# Patient Record
Sex: Male | Born: 1970 | Hispanic: No | Smoking: Current some day smoker
Health system: Southern US, Community
[De-identification: ages and names within clinical notes are randomized; demographics above are authoritative.]

## PROBLEM LIST (undated history)

## (undated) DIAGNOSIS — J329 Chronic sinusitis, unspecified: Secondary | ICD-10-CM

## (undated) DIAGNOSIS — J45909 Unspecified asthma, uncomplicated: Secondary | ICD-10-CM

## (undated) DIAGNOSIS — J80 Acute respiratory distress syndrome: Secondary | ICD-10-CM

## (undated) DIAGNOSIS — F419 Anxiety disorder, unspecified: Secondary | ICD-10-CM

---

## 2007-04-16 ENCOUNTER — Emergency Department (HOSPITAL_COMMUNITY): Admission: EM | Admit: 2007-04-16 | Discharge: 2007-04-16 | Payer: Self-pay | Admitting: *Deleted

## 2008-03-28 LAB — PULMONARY FUNCTION TEST

## 2008-04-04 HISTORY — PX: BUNIONECTOMY: SHX129

## 2010-12-09 LAB — POCT INFLUENZA A/B: Influenza B, POC: NEGATIVE
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
## 2011-11-09 ENCOUNTER — Emergency Department
Admission: EM | Admit: 2011-11-09 | Discharge: 2011-11-09 | Disposition: A | Discharge status: Home / Self Care | Payer: Managed Care, Other (non HMO) | Source: Home / Self Care | Attending: Emergency Medicine | Admitting: Emergency Medicine

## 2011-11-09 ENCOUNTER — Encounter: Payer: Self-pay | Admitting: Emergency Medicine

## 2011-11-09 DIAGNOSIS — J069 Acute upper respiratory infection, unspecified: Secondary | ICD-10-CM

## 2011-11-09 MED ORDER — AMOXICILLIN-POT CLAVULANATE 875-125 MG PO TABS: 1.0000 | ORAL_TABLET | Freq: Two times a day (BID) | ORAL | Status: AC

## 2011-11-09 NOTE — ED Provider Notes (Signed)
History     CSN: [id number] Arrival date & time: 11/09/2011  1:59 PM   First MD Initiated Contact with Patient 11/09/11 1422      Chief Complaint  Patient presents with  . URI    (Consider location/radiation/quality/duration/timing/severity/associated sxs/prior treatment) HPI Grant Peterson is a 40 y.o. male who complains of onset of cold symptoms for 4 days.  The symptoms are listed below. Also over the last one to 2 months he has been having some lingering postnasal drip cough and congestion. Since yesterday he is feeling a lot better but decided to come to have it checked out today. No sore throat + cough No pleuritic pain No wheezing + nasal congestion + post-nasal drainage + sinus pain/pressure No chest congestion No itchy/red eyes No earache No hemoptysis No SOB + chills/sweats + fever + nausea No vomiting No abdominal pain No diarrhea No skin rashes + fatigue + myalgias + headache     History reviewed. No pertinent past medical history.  No past surgical history on file.  No family history on file.  History  Substance Use Topics  . Smoking status: Not on file  . Smokeless tobacco: Not on file  . Alcohol Use: Not on file      Review of Systems  Allergies  Review of patient's allergies indicates no known allergies.  Home Medications   Current Outpatient Rx  Name Route Sig Dispense Refill  . GUAIFENESIN ER 600 MG PO TB12 Oral Take 1,200 mg by mouth 2 (two) times daily.      Marland Kitchen PHENYLEPHRINE HCL 10 MG PO TABS Oral Take 10 mg by mouth every 4 (four) hours as needed.        BP 139/67  Pulse 77  Temp(Src) 99.2 F (37.3 C) (Oral)  Resp 16  Ht 5\' 7"  (1.702 m)  Wt 175 lb (79.379 kg)  BMI 27.41 kg/m2  SpO2 99%  Physical Exam  Nursing note and vitals reviewed. Constitutional: He is oriented to person, place, and time. He appears well-developed and well-nourished.  HENT:  Head: Normocephalic and atraumatic.  Right Ear: Tympanic membrane, external  ear and ear canal normal.  Left Ear: Tympanic membrane, external ear and ear canal normal.  Nose: Mucosal edema and rhinorrhea present.  Mouth/Throat: Posterior oropharyngeal erythema present. No oropharyngeal exudate or posterior oropharyngeal edema.  Neck: Neck supple.  Cardiovascular: Regular rhythm and normal heart sounds.   Pulmonary/Chest: Effort normal and breath sounds normal. No respiratory distress.  Neurological: He is alert and oriented to person, place, and time.  Skin: Skin is warm and dry.  Psychiatric: He has a normal mood and affect. His speech is normal.    ED Course  Procedures (including critical care time)  Labs Reviewed - No data to display No results found.   No diagnosis found.    MDM  1)  Take the prescribed antibiotic as instructed. The rapid flu test is negative. Since he has had lingering symptoms off and on I would like treatment for chronic sinusitis with 10 days of Augmentin. This would also cover some bronchitis since I did hear some wheezing on his physical examination. 2)  Use nasal saline solution (over the counter) at least 3 times a day. 3)  Use over the counter decongestants like Zyrtec-D every 12 hours as needed to help with congestion.  If you have hypertension, do not take medicines with sudafed.  4)  Can take tylenol every 6 hours or motrin every 8 hours for pain or fever.  5)  Follow up with your primary doctor if no improvement in 5-7 days, sooner if increasing pain, fever, or new symptoms.     Lily Kocher, MD 11/09/11 1434

## 2011-11-09 NOTE — ED Notes (Signed)
Sinus pressure, headache, post-nasal drip x 2 months Fever, chills, nausea, chills, congestion, body aches, fatigue, productive cough (green) loose stools x 4days

## 2012-07-10 ENCOUNTER — Emergency Department
Admission: EM | Admit: 2012-07-10 | Discharge: 2012-07-10 | Disposition: A | Discharge status: Home / Self Care | Payer: Managed Care, Other (non HMO) | Source: Home / Self Care

## 2012-07-10 ENCOUNTER — Encounter: Payer: Self-pay | Admitting: *Deleted

## 2012-07-10 DIAGNOSIS — J069 Acute upper respiratory infection, unspecified: Secondary | ICD-10-CM

## 2012-07-10 HISTORY — DX: Acute respiratory distress syndrome: J80

## 2012-07-10 HISTORY — DX: Unspecified asthma, uncomplicated: J45.909

## 2012-07-10 MED ORDER — AZITHROMYCIN 250 MG PO TABS: ORAL_TABLET | ORAL | Status: AC

## 2012-07-10 MED ORDER — METHYLPREDNISOLONE ACETATE 80 MG/ML IJ SUSP
80.0000 mg | Freq: Once | INTRAMUSCULAR | Status: AC
  Administered 2012-07-10: 80 mg via INTRAMUSCULAR

## 2012-07-10 NOTE — ED Provider Notes (Signed)
Agree with care plan.  
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
## 2012-07-10 NOTE — ED Notes (Signed)
Patient c/o productive cough, congested, fatigue, and sob x 4 days. Taken vitamin c and afrin otc.

## 2012-12-20 ENCOUNTER — Emergency Department
Admission: EM | Admit: 2012-12-20 | Discharge: 2012-12-20 | Disposition: A | Discharge status: Home / Self Care | Payer: 59 | Source: Home / Self Care | Attending: Family Medicine | Admitting: Family Medicine

## 2012-12-20 ENCOUNTER — Encounter: Payer: Self-pay | Admitting: Emergency Medicine

## 2012-12-20 ENCOUNTER — Emergency Department (INDEPENDENT_AMBULATORY_CARE_PROVIDER_SITE_OTHER): Payer: 59

## 2012-12-20 DIAGNOSIS — J45901 Unspecified asthma with (acute) exacerbation: Secondary | ICD-10-CM

## 2012-12-20 DIAGNOSIS — R05 Cough: Secondary | ICD-10-CM

## 2012-12-20 DIAGNOSIS — J309 Allergic rhinitis, unspecified: Secondary | ICD-10-CM

## 2012-12-20 DIAGNOSIS — R0989 Other specified symptoms and signs involving the circulatory and respiratory systems: Secondary | ICD-10-CM

## 2012-12-20 DIAGNOSIS — Z716 Tobacco abuse counseling: Secondary | ICD-10-CM

## 2012-12-20 DIAGNOSIS — Z87891 Personal history of nicotine dependence: Secondary | ICD-10-CM

## 2012-12-20 DIAGNOSIS — J329 Chronic sinusitis, unspecified: Secondary | ICD-10-CM

## 2012-12-20 MED ORDER — FLUTICASONE-SALMETEROL 100-50 MCG/DOSE IN AEPB: 1.0000 | INHALATION_SPRAY | Freq: Two times a day (BID) | RESPIRATORY_TRACT | Status: DC

## 2012-12-20 MED ORDER — CETIRIZINE HCL 10 MG PO CAPS: 10.0000 mg | ORAL_CAPSULE | Freq: Every day | ORAL | Status: DC

## 2012-12-20 MED ORDER — PREDNISONE 50 MG PO TABS: ORAL_TABLET | ORAL | Status: DC

## 2012-12-20 MED ORDER — METHYLPREDNISOLONE SODIUM SUCC 125 MG IJ SOLR
125.0000 mg | Freq: Once | INTRAMUSCULAR | Status: AC
  Administered 2012-12-20: 125 mg via INTRAMUSCULAR

## 2012-12-20 MED ORDER — FLUTICASONE PROPIONATE 50 MCG/ACT NA SUSP: 2.0000 | Freq: Every day | NASAL | Status: DC

## 2012-12-20 MED ORDER — AZITHROMYCIN 250 MG PO TABS: ORAL_TABLET | ORAL | Status: DC

## 2012-12-20 NOTE — ED Notes (Signed)
Reports hx of nasal congestion x 3 months; has some dyspnea on exertion and occasional wheezing. No Flu vaccination this season.
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
## 2012-12-22 ENCOUNTER — Telehealth: Payer: Self-pay | Admitting: Emergency Medicine

## 2014-03-17 ENCOUNTER — Encounter (INDEPENDENT_AMBULATORY_CARE_PROVIDER_SITE_OTHER): Payer: Self-pay

## 2014-03-17 ENCOUNTER — Encounter: Payer: Self-pay | Admitting: Family Medicine

## 2014-03-17 ENCOUNTER — Ambulatory Visit (INDEPENDENT_AMBULATORY_CARE_PROVIDER_SITE_OTHER): Payer: 59 | Admitting: Family Medicine

## 2014-03-17 VITALS — BP 128/80 | HR 69 | Ht 66.0 in | Wt 177.0 lb

## 2014-03-17 DIAGNOSIS — F3289 Other specified depressive episodes: Secondary | ICD-10-CM

## 2014-03-17 DIAGNOSIS — K5900 Constipation, unspecified: Secondary | ICD-10-CM

## 2014-03-17 DIAGNOSIS — F32A Depression, unspecified: Secondary | ICD-10-CM

## 2014-03-17 DIAGNOSIS — J309 Allergic rhinitis, unspecified: Secondary | ICD-10-CM

## 2014-03-17 DIAGNOSIS — J45909 Unspecified asthma, uncomplicated: Secondary | ICD-10-CM | POA: Insufficient documentation

## 2014-03-17 DIAGNOSIS — F329 Major depressive disorder, single episode, unspecified: Secondary | ICD-10-CM

## 2014-03-17 DIAGNOSIS — T1490XA Injury, unspecified, initial encounter: Secondary | ICD-10-CM

## 2014-03-17 DIAGNOSIS — J302 Other seasonal allergic rhinitis: Secondary | ICD-10-CM

## 2014-03-17 MED ORDER — FLUTICASONE PROPIONATE 50 MCG/ACT NA SUSP: 2.0000 | Freq: Every day | NASAL | Status: DC

## 2014-03-17 MED ORDER — CITALOPRAM HYDROBROMIDE 20 MG PO TABS: 10.0000 mg | ORAL_TABLET | Freq: Every day | ORAL | Status: DC

## 2014-03-17 MED ORDER — FLUTICASONE-SALMETEROL 100-50 MCG/DOSE IN AEPB: 1.0000 | INHALATION_SPRAY | Freq: Two times a day (BID) | RESPIRATORY_TRACT | Status: DC

## 2014-03-17 NOTE — Progress Notes (Signed)
CC: Grant Peterson is a 43 y.o. 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  Denies any recent or remote exertional chest pain. He does get shortness of breath with physical activity which is described as mild and has not been getting better or worse for matter of years. It does not limit him in any exertional activity.  Reports a history of [place] which [place] [facility].  Since this he experiences wheezing and less using Advair on a daily basis. He denies any need or use of albuterol over the past year. Nothing particularly makes symptoms better or worse other than above. He denies cough, wheezing, nor chest tightness.  Reports subjective depression that is mild in severity described as feelings of inadequacy in all aspects of life.  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  Reports difficulty falling asleep due to racing thoughts of activities that occur during the day. He denies any reoccurring fears or paranoia.  He's been self treating the above symptoms with drinking alcohol most nights of the week, he has never been in trouble for health reasons or discipline reasons due to alcohol. Symptoms have been present ever since the [facility] episode. Nothing particularly makes better or worse other than above.  Reports a history of chronic nasal congestion that has been present for over 10 years. Overall mild in severity however absent when using Flonase. He's been using this on a daily basis for matter of years. Currently denies any sinus pain or recent or remote epistaxis.  Complains of constipation that has been present off and on for matter of months. Described as only defecating  every other day, when this occurs it is slightly painful he denies any tearing sensation melena nor blood in stool recently or remotely. Denies any abdominal pain. He reports that he ingests almost no fiber and other leafy green vegetables gets no fruit or other vegetables. Denies decreased caliber of stools   Review of Systems - General ROS: negative for - chills, fever, night sweats, weight gain or weight loss Ophthalmic ROS: negative for - decreased vision ENT ROS: negative for - hearing change,  tinnitus or allergies Hematological and Lymphatic ROS: negative for - bleeding problems, bruising or swollen lymph nodes Breast ROS: negative Respiratory ROS: no cough, shortness of breath, or wheezing other than that described above Cardiovascular ROS: no chest pain or dyspnea on exertion Gastrointestinal ROS: no abdominal pain, change in bowel habits, or black or bloody stools Genito-Urinary ROS: negative for - genital discharge, genital ulcers, incontinence or abnormal bleeding from genitals Musculoskeletal ROS: negative for - joint pain or muscle pain Neurological ROS: negative for - headaches or memory loss Dermatological ROS: negative for lumps, mole changes, rash and skin lesion changes  Past Medical History  Diagnosis Date  . Adult respiratory distress syndrome   . Reactive airway disease     Past Surgical History  Procedure Laterality Date  . Bunionectomy  04/2008    right   Family History  Problem Relation Age of Onset  . Asthma Mother   . COPD Mother   . Hypertension Father   . Hyperlipidemia Father   . Parkinsonism Father   .  Alcoholism      grandfather  . Lung cancer      grandfather    History   Social History  . Marital Status: Single    Spouse Name: N/A    Number of Children: N/A  . Years of Education: N/A   Occupational History  . Not on file.   Social History Main Topics  . Smoking status: Current Every Day Smoker  . Smokeless tobacco: Not on file  .  Alcohol Use: Yes  . Drug Use: No  . Sexual Activity: Yes    Partners: Female   Other Topics Concern  . Not on file   Social History Narrative  . No narrative on file     Objective: BP 128/80  Pulse 69  Ht 5\' 6"  (1.676 m)  Wt 177 lb (80.287 kg)  BMI 28.58 kg/m2  PF 600 L/min  General: Alert and Oriented, No Acute Distress HEENT: Pupils equal, round, reactive to light. Conjunctivae clear.  External ears unremarkable, canals clear with intact TMs with appropriate landmarks.  Middle ear appears open without effusion. Pink inferior turbinates.  Moist mucous membranes, pharynx without inflammation nor lesions.  Neck supple without palpable lymphadenopathy nor abnormal masses. Lungs: Clear to auscultation bilaterally, no wheezing/ronchi/rales.  Comfortable work of breathing. Good air movement. Cardiac: Regular rate and rhythm. Normal S1/S2.  No murmurs, rubs, nor gallops.  . Extremities: No peripheral edema.  Strong peripheral pulses.  Mental Status: No depression, anxiety, nor agitation. Skin: Warm and dry.  Assessment & Plan: Grant Peterson was seen today for establish care.  Diagnoses and associated orders for this visit:  Depression - citalopram (CELEXA) 20 MG tablet; Take 0.5 tablets (10 mg total) by mouth daily.  Asthma, chronic - Fluticasone-Salmeterol (ADVAIR DISKUS) 100-50 MCG/DOSE AEPB; Inhale 1 puff into the lungs 2 (two) times daily.  Seasonal allergies - fluticasone (FLONASE) 50 MCG/ACT nasal spray; Place 2 sprays into both nostrils daily.  Inhalation injury  Constipation    Depression: Uncontrolled start low-dose citalopram return in 4 weeks for reevaluation Asthma: Controlled continue Advair Seasonal allergies: Controlled continue Flonase Constipation: Uncontrolled start 2 heaping teaspoons of psyllium powder in water every evening  Cleared [facility]    Return in about 4 weeks (around 04/14/2014).

## 2014-04-21 ENCOUNTER — Ambulatory Visit: Payer: 59 | Admitting: Family Medicine

## 2014-11-13 LAB — BASIC METABOLIC PANEL
Creatinine: 1.2 mg/dL (ref 0.6–1.3)
GLUCOSE: 86 mg/dL
Potassium: 4.4 mmol/L (ref 3.4–5.3)
SODIUM: 139 mmol/L (ref 137–147)

## 2014-11-13 LAB — HEPATIC FUNCTION PANEL
ALK PHOS: 60 U/L (ref 25–125)
ALT: 26 U/L (ref 10–40)
AST: 21 U/L (ref 14–40)

## 2014-11-13 LAB — CMP14+LP+1AC+CBC/D/PLT+TSH
Calcium: 9.3 mg/dL
Protein, Total: 7

## 2014-11-13 LAB — CBC AND DIFFERENTIAL
Hemoglobin: 15.4 g/dL (ref 13.5–17.5)
Platelets: 279 10*3/uL (ref 150–399)
WBC: 4.5 10*3/mL

## 2014-11-13 LAB — LIPID PANEL
CHOLESTEROL: 210 mg/dL — AB (ref 0–200)
HDL: 49 mg/dL (ref 35–70)
TRIGLYCERIDES: 456 mg/dL — AB (ref 40–160)

## 2014-11-20 ENCOUNTER — Telehealth: Payer: Self-pay | Admitting: Family Medicine

## 2014-11-20 ENCOUNTER — Encounter: Payer: Self-pay | Admitting: Family Medicine

## 2014-11-20 DIAGNOSIS — E781 Pure hyperglyceridemia: Secondary | ICD-10-CM | POA: Insufficient documentation

## 2014-11-20 NOTE — Telephone Encounter (Signed)
Sue Lushndrea, Will you please let patient know that I received labs form [facility] reflecting a significantly elevated triglyceride level.  I would recommend he f/u with me w/in the next 4 weeks to look into what's causing this and how to control it.

## 2014-11-20 NOTE — Telephone Encounter (Signed)
Pt.notified

## 2014-12-11 ENCOUNTER — Encounter: Payer: Self-pay | Admitting: Family Medicine

## 2014-12-11 ENCOUNTER — Ambulatory Visit (INDEPENDENT_AMBULATORY_CARE_PROVIDER_SITE_OTHER): Payer: 59 | Admitting: Family Medicine

## 2014-12-11 VITALS — BP 141/90 | HR 66 | Wt 180.0 lb

## 2014-12-11 DIAGNOSIS — F32A Depression, unspecified: Secondary | ICD-10-CM

## 2014-12-11 DIAGNOSIS — F329 Major depressive disorder, single episode, unspecified: Secondary | ICD-10-CM

## 2014-12-11 DIAGNOSIS — E781 Pure hyperglyceridemia: Secondary | ICD-10-CM

## 2014-12-11 DIAGNOSIS — J4530 Mild persistent asthma, uncomplicated: Secondary | ICD-10-CM

## 2014-12-11 MED ORDER — CITALOPRAM HYDROBROMIDE 20 MG PO TABS: 20.0000 mg | ORAL_TABLET | Freq: Every day | ORAL | Status: DC

## 2014-12-11 MED ORDER — FLUTICASONE FUROATE-VILANTEROL 100-25 MCG/INH IN AEPB: INHALATION_SPRAY | RESPIRATORY_TRACT | Status: DC
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
## 2015-02-05 ENCOUNTER — Encounter: Payer: Self-pay | Admitting: Family Medicine

## 2015-02-05 DIAGNOSIS — E785 Hyperlipidemia, unspecified: Secondary | ICD-10-CM | POA: Insufficient documentation

## 2015-02-05 LAB — LIPID PANEL
CHOL/HDL RATIO: 3.7 ratio
Cholesterol: 228 mg/dL — ABNORMAL HIGH (ref 0–200)
HDL: 61 mg/dL (ref >=40)
LDL CALC: 150 mg/dL — AB (ref 0–99)
TRIGLYCERIDES: 84 mg/dL (ref <150)
VLDL: 17 mg/dL (ref 0–40)

## 2015-02-13 ENCOUNTER — Encounter: Payer: Self-pay | Admitting: Family Medicine

## 2015-11-24 ENCOUNTER — Other Ambulatory Visit: Payer: Self-pay | Admitting: Family Medicine

## 2016-01-20 ENCOUNTER — Encounter: Payer: Self-pay | Admitting: Emergency Medicine

## 2016-01-20 ENCOUNTER — Emergency Department
Admission: EM | Admit: 2016-01-20 | Discharge: 2016-01-20 | Disposition: A | Discharge status: Home / Self Care | Payer: Commercial Managed Care - HMO | Source: Home / Self Care | Attending: Family Medicine | Admitting: Family Medicine

## 2016-01-20 DIAGNOSIS — J0100 Acute maxillary sinusitis, unspecified: Secondary | ICD-10-CM | POA: Diagnosis not present

## 2016-01-20 DIAGNOSIS — J069 Acute upper respiratory infection, unspecified: Secondary | ICD-10-CM | POA: Diagnosis not present

## 2016-01-20 DIAGNOSIS — Z760 Encounter for issue of repeat prescription: Secondary | ICD-10-CM | POA: Diagnosis not present

## 2016-01-20 DIAGNOSIS — J4530 Mild persistent asthma, uncomplicated: Secondary | ICD-10-CM

## 2016-01-20 MED ORDER — FLUTICASONE FUROATE-VILANTEROL 100-25 MCG/INH IN AEPB: INHALATION_SPRAY | RESPIRATORY_TRACT | Status: DC

## 2016-01-20 MED ORDER — ALBUTEROL SULFATE HFA 108 (90 BASE) MCG/ACT IN AERS: 1.0000 | INHALATION_SPRAY | Freq: Four times a day (QID) | RESPIRATORY_TRACT | Status: DC | PRN

## 2016-01-20 MED ORDER — PREDNISONE 20 MG PO TABS: ORAL_TABLET | ORAL | Status: DC

## 2016-01-20 MED ORDER — AMOXICILLIN-POT CLAVULANATE 875-125 MG PO TABS: 1.0000 | ORAL_TABLET | Freq: Two times a day (BID) | ORAL | Status: DC
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
## 2016-01-20 NOTE — ED Notes (Signed)
Congestion, headache, fatigue, body aches, chills x 1 week

## 2016-01-20 NOTE — Discharge Instructions (Signed)

## 2016-01-24 ENCOUNTER — Telehealth: Payer: Self-pay | Admitting: Emergency Medicine

## 2016-02-10 ENCOUNTER — Other Ambulatory Visit: Payer: Self-pay | Admitting: Family Medicine

## 2016-04-12 ENCOUNTER — Encounter: Payer: Self-pay | Admitting: *Deleted

## 2016-04-12 ENCOUNTER — Emergency Department
Admission: EM | Admit: 2016-04-12 | Discharge: 2016-04-12 | Disposition: A | Discharge status: Home / Self Care | Payer: Commercial Managed Care - HMO | Source: Home / Self Care

## 2016-04-12 DIAGNOSIS — J0120 Acute ethmoidal sinusitis, unspecified: Secondary | ICD-10-CM

## 2016-04-12 MED ORDER — PREDNISONE 10 MG PO TABS: ORAL_TABLET | ORAL | Status: DC

## 2016-04-12 MED ORDER — AMOXICILLIN-POT CLAVULANATE 875-125 MG PO TABS: 1.0000 | ORAL_TABLET | Freq: Two times a day (BID) | ORAL | Status: DC

## 2016-04-12 NOTE — ED Provider Notes (Signed)
CSN: [id number]     Arrival date & time 04/12/16  1032 History   None    Chief Complaint  Patient presents with  . Nasal Congestion   (Consider location/radiation/quality/duration/timing/severity/associated sxs/prior Treatment) Patient is a 45 y.o. male presenting with cough. The history is provided by the patient. No language interpreter was used.  Cough Cough characteristics:  Non-productive Severity:  Moderate Onset quality:  Gradual Duration:  6 weeks Timing:  Constant Progression:  Worsening Chronicity:  New Smoker: no   Context: upper respiratory infection   Relieved by:  Nothing Worsened by:  Nothing tried Ineffective treatments:  None tried Associated symptoms: shortness of breath and sinus congestion   Risk factors: no recent infection   Pt was a Psychologist, [employer] firefighter.  He has not had a check up in 7 years.    Past Medical History  Diagnosis Date  . Adult respiratory distress syndrome (HCC)   . Reactive airway disease    Past Surgical History  Procedure Laterality Date  . Bunionectomy  04/2008    right   Family History  Problem Relation Age of Onset  . Asthma Mother   . COPD Mother   . Hypertension Father   . Hyperlipidemia Father   . Parkinsonism Father   . Alcoholism      grandfather  . Lung cancer      grandfather   Social History  Substance Use Topics  . Smoking status: Current Every Day Smoker  . Smokeless tobacco: None  . Alcohol Use: Yes    Review of Systems  Respiratory: Positive for cough and shortness of breath.   All other systems reviewed and are negative.   Allergies  Zoloft  Home Medications   Prior to Admission medications   Medication Sig Start Date End Date Taking? Authorizing Provider  Cetirizine HCl 10 MG CAPS Take 1 capsule (10 mg total) by mouth daily. 12/20/12  Yes Floydene FlockSteven J Newton, MD  citalopram (CELEXA) 20 MG tablet TAKE ONE TABLET BY MOUTH ONCE DAILY 02/11/16  Yes Sean Hommel, DO  fluticasone (FLONASE) 50 MCG/ACT nasal spray  Place 2 sprays into both nostrils daily. 03/17/14  Yes Sean Hommel, DO  fluticasone furoate-vilanterol (BREO ELLIPTA) 100-25 MCG/INH AEPB One inhalation daily. 01/20/16  Yes Junius FinnerErin O'Malley, PA-C  omeprazole (PRILOSEC) 10 MG capsule Take 10 mg by mouth daily.   Yes Historical Provider, MD  albuterol (PROVENTIL HFA;VENTOLIN HFA) 108 (90 Base) MCG/ACT inhaler Inhale 1-2 puffs into the lungs every 6 (six) hours as needed for wheezing or shortness of breath. 01/20/16   Junius FinnerErin O'Malley, PA-C  amoxicillin-clavulanate (AUGMENTIN) 875-125 MG tablet Take 1 tablet by mouth every 12 (twelve) hours. 04/12/16   Elson AreasLeslie K Elyanah Farino, PA-C  predniSONE (DELTASONE) 10 MG tablet 0,8,6,5,7,86,5,4,3,2,1 taper 04/12/16   Elson AreasLeslie K Eriyonna Matsushita, PA-C   Meds Ordered and Administered this Visit  Medications - No data to display  BP 156/103 mmHg  Pulse 72  Temp(Src) 98.4 F (36.9 C) (Oral)  Wt 180 lb (81.647 kg)  SpO2 97% No data found.   Physical Exam  Constitutional: He appears well-developed and well-nourished.  HENT:  Head: Normocephalic.  Right Ear: External ear normal.  Left Ear: External ear normal.  Mouth/Throat: Oropharynx is clear and moist.  Tender bilat maxillary sinuses,  Nasal congestion.    Eyes: Pupils are equal, round, and reactive to light.  Neck: Normal range of motion.  Cardiovascular: Normal rate.   Pulmonary/Chest: Effort normal.  Abdominal: Soft.  Musculoskeletal: Normal range of motion.  Neurological:  He is alert.  Skin: Skin is warm.  Psychiatric: He has a normal mood and affect.  Nursing note and vitals reviewed.   ED Course  Procedures (including critical care time)  Labs Review Labs Reviewed - No data to display  Imaging Review No results found.   Visual Acuity Review  Right Eye Distance:   Left Eye Distance:   Bilateral Distance:    Right Eye Near:   Left Eye Near:    Bilateral Near:         MDM  Pt has had symptoms for an extended period of time.  I will treat with augmentin and  prednisone taper   1. Subacute ethmoidal sinusitis    An After Visit Summary was printed and given to the patient. Pt advised to follow up with Dr. Ivan Anchors.  Meds ordered this encounter  Medications  . amoxicillin-clavulanate (AUGMENTIN) 875-125 MG tablet    Sig: Take 1 tablet by mouth every 12 (twelve) hours.    Dispense:  20 tablet    Refill:  0    Order Specific Question:  Supervising Provider    Answer:  Georgina Pillion, DAVID [[id number]]  . predniSONE (DELTASONE) 10 MG tablet    Sig: 6,5,4,3,2,1 taper    Dispense:  21 tablet    Refill:  0    Order Specific Question:  Supervising Provider    Answer:  Georgina Pillion, DAVID [[id number]]     Medication List    TAKE these medications        amoxicillin-clavulanate 875-125 MG tablet  Commonly known as:  AUGMENTIN  Take 1 tablet by mouth every 12 (twelve) hours.     predniSONE 10 MG tablet  Commonly known as:  DELTASONE  6,5,4,3,2,1 taper      ASK your doctor about these medications        albuterol 108 (90 Base) MCG/ACT inhaler  Commonly known as:  PROVENTIL HFA;VENTOLIN HFA  Inhale 1-2 puffs into the lungs every 6 (six) hours as needed for wheezing or shortness of breath.     Cetirizine HCl 10 MG Caps  Take 1 capsule (10 mg total) by mouth daily.     citalopram 20 MG tablet  Commonly known as:  CELEXA  TAKE ONE TABLET BY MOUTH ONCE DAILY     fluticasone 50 MCG/ACT nasal spray  Commonly known as:  FLONASE  Place 2 sprays into both nostrils daily.     fluticasone furoate-vilanterol 100-25 MCG/INH Aepb  Commonly known as:  BREO ELLIPTA  One inhalation daily.     omeprazole 10 MG capsule  Commonly known as:  PRILOSEC  Take 10 mg by mouth daily.         Lonia Skinner Neffs, PA-C 04/12/16 1158

## 2016-04-12 NOTE — ED Notes (Signed)
Pt c/o nasal congestion x 6 weeks, runny nose since yesterday. Afebrile. Taken Sudafed and Afrin OTC.

## 2016-04-12 NOTE — Discharge Instructions (Signed)

## 2016-04-27 ENCOUNTER — Other Ambulatory Visit: Payer: Self-pay | Admitting: Family Medicine

## 2016-04-28 ENCOUNTER — Emergency Department
Admission: EM | Admit: 2016-04-28 | Discharge: 2016-04-28 | Disposition: A | Discharge status: Home / Self Care | Payer: Self-pay | Source: Home / Self Care | Attending: Family Medicine | Admitting: Family Medicine

## 2016-04-28 ENCOUNTER — Encounter: Payer: Self-pay | Admitting: Emergency Medicine

## 2016-04-28 DIAGNOSIS — J0101 Acute recurrent maxillary sinusitis: Secondary | ICD-10-CM

## 2016-04-28 HISTORY — DX: Anxiety disorder, unspecified: F41.9

## 2016-04-28 MED ORDER — PREDNISONE 20 MG PO TABS: ORAL_TABLET | ORAL | Status: DC

## 2016-04-28 MED ORDER — DOXYCYCLINE HYCLATE 100 MG PO CAPS: 100.0000 mg | ORAL_CAPSULE | Freq: Two times a day (BID) | ORAL | Status: DC

## 2016-04-28 NOTE — Discharge Instructions (Signed)
You may take 400-600mg Ibuprofen (Motrin) every 6-8 hours for fever and pain  °Alternate with Tylenol  °You may take 500mg Tylenol every 4-6 hours as needed for fever and pain  °Follow-up with your primary care provider next week for recheck of symptoms if not improving.  °Be sure to drink plenty of fluids and rest, at least 8hrs of sleep a night, preferably more while you are sick. °Return urgent care or go to closest ER if you cannot keep down fluids/signs of dehydration, fever not reducing with Tylenol, difficulty breathing/wheezing, stiff neck, worsening condition, or other concerns (see below)  °Please take antibiotics as prescribed and be sure to complete entire course even if you start to feel better to ensure infection does not come back. ° ° °Sinus Rinse °WHAT IS A SINUS RINSE? °A sinus rinse is a simple home treatment that is used to rinse your sinuses with a sterile mixture of salt and water (saline solution). Sinuses are air-filled spaces in your skull behind the bones of your face and forehead that open into your nasal cavity. °You will use the following: °· Saline solution. °· Neti pot or spray bottle. This releases the saline solution into your nose and through your sinuses. Neti pots and spray bottles can be purchased at your local pharmacy, a health food store, or online. °WHEN WOULD I DO A SINUS RINSE? °A sinus rinse can help to clear mucus, dirt, dust, or pollen from the nasal cavity. You may do a sinus rinse when you have a cold, a virus, nasal allergy symptoms, a sinus infection, or stuffiness in the nose or sinuses. °If you are considering a sinus rinse: °· Ask your child's health care provider before performing a sinus rinse on your child. °· Do not do a sinus rinse if you have had ear or nasal surgery, ear infection, or blocked ears. °HOW DO I DO A SINUS RINSE? °· Wash your hands. °· Disinfect your device according to the directions provided and then dry it. °· Use the solution that comes  with your device or one that is sold separately in stores. Follow the mixing directions on the package. °· Fill your device with the amount of saline solution as directed by the device instructions. °· Stand over a sink and tilt your head sideways over the sink. °· Place the spout of the device in your upper nostril (the one closer to the ceiling). °· Gently pour or squeeze the saline solution into the nasal cavity. The liquid should drain to the lower nostril if you are not overly congested. °· Gently blow your nose. Blowing too hard may cause ear pain. °· Repeat in the other nostril. °· Clean and rinse your device with clean water and then air-dry it. °ARE THERE RISKS OF A SINUS RINSE?  °Sinus rinse is generally very safe and effective. However, there are a few risks, which include:  °· A burning sensation in the sinuses. This may happen if you do not make the saline solution as directed. Make sure to follow all directions when making the saline solution. °· Infection from contaminated water. This is rare, but possible. °· Nasal irritation. °  °This information is not intended to replace advice given to you by your health care provider. Make sure you discuss any questions you have with your health care provider. °  °Document Released: 06/18/2014 Document Reviewed: 06/18/2014 °Elsevier Interactive Patient Education ©2016 [employer] ° °Sinusitis, Adult °Sinusitis is redness, soreness, and inflammation of the paranasal sinuses.   Paranasal sinuses are air pockets within the bones of your face. They are located beneath your eyes, in the middle of your forehead, and above your eyes. In healthy paranasal sinuses, mucus is able to drain out, and air is able to circulate through them by way of your nose. However, when your paranasal sinuses are inflamed, mucus and air can become trapped. This can allow bacteria and other germs to grow and cause infection. °Sinusitis can develop quickly and last only a short time (acute)  or continue over a long period (chronic). Sinusitis that lasts for more than 12 weeks is considered chronic. °CAUSES °Causes of sinusitis include: °· Allergies. °· Structural abnormalities, such as displacement of the cartilage that separates your nostrils (deviated septum), which can decrease the air flow through your nose and sinuses and affect sinus drainage. °· Functional abnormalities, such as when the small hairs (cilia) that line your sinuses and help remove mucus do not work properly or are not present. °SIGNS AND SYMPTOMS °Symptoms of acute and chronic sinusitis are the same. The primary symptoms are pain and pressure around the affected sinuses. Other symptoms include: °· Upper toothache. °· Earache. °· Headache. °· Bad breath. °· Decreased sense of smell and taste. °· A cough, which worsens when you are lying flat. °· Fatigue. °· Fever. °· Thick drainage from your nose, which often is green and may contain pus (purulent). °· Swelling and warmth over the affected sinuses. °DIAGNOSIS °Your health care provider will perform a physical exam. During your exam, your health care provider may perform any of the following to help determine if you have acute sinusitis or chronic sinusitis: °· Look in your nose for signs of abnormal growths in your nostrils (nasal polyps). °· Tap over the affected sinus to check for signs of infection. °· View the inside of your sinuses using an imaging device that has a light attached (endoscope). °If your health care provider suspects that you have chronic sinusitis, one or more of the following tests may be recommended: °· Allergy tests. °· Nasal culture. A sample of mucus is taken from your nose, sent to a lab, and screened for bacteria. °· Nasal cytology. A sample of mucus is taken from your nose and examined by your health care provider to determine if your sinusitis is related to an allergy. °TREATMENT °Most cases of acute sinusitis are related to a viral infection and will  resolve on their own within 10 days. Sometimes, medicines are prescribed to help relieve symptoms of both acute and chronic sinusitis. These may include pain medicines, decongestants, nasal steroid sprays, or saline sprays. °However, for sinusitis related to a bacterial infection, your health care provider will prescribe antibiotic medicines. These are medicines that will help kill the bacteria causing the infection. °Rarely, sinusitis is caused by a fungal infection. In these cases, your health care provider will prescribe antifungal medicine. °For some cases of chronic sinusitis, surgery is needed. Generally, these are cases in which sinusitis recurs more than 3 times per year, despite other treatments. °HOME CARE INSTRUCTIONS °· Drink plenty of water. Water helps thin the mucus so your sinuses can drain more easily. °· Use a humidifier. °· Inhale steam 3-4 times a day (for example, sit in the bathroom with the shower running). °· Apply a warm, moist washcloth to your face 3-4 times a day, or as directed by your health care provider. °· Use saline nasal sprays to help moisten and clean your sinuses. °· Take medicines only as directed   by your health care provider. °· If you were prescribed either an antibiotic or antifungal medicine, finish it all even if you start to feel better. °SEEK IMMEDIATE MEDICAL CARE IF: °· You have increasing pain or severe headaches. °· You have nausea, vomiting, or drowsiness. °· You have swelling around your face. °· You have vision problems. °· You have a stiff neck. °· You have difficulty breathing. °  °This information is not intended to replace advice given to you by your health care provider. Make sure you discuss any questions you have with your health care provider. °  °Document Released: 11/21/2005 Document Revised: 12/12/2014 Document Reviewed: 12/06/2011 °Elsevier Interactive Patient Education ©2016 [employer] Inc[employer] ° ° °

## 2016-04-28 NOTE — ED Provider Notes (Signed)
CSN: [id number]     Arrival date & time 04/28/16  1436 History   None    Chief Complaint  Patient presents with  . Nasal Congestion   (Consider location/radiation/quality/duration/timing/severity/associated sxs/prior Treatment) HPI The pt is a 45yo male presenting to [facility] with c/o recurrent sinus congestion and pressure. He was seen at [facility] on 04/12/16 and dx with sinusitis. He has completed a course of prednisone and augmentin. Symptoms did resolve for about 1-2 weeks but then started to come back 1 week ago, gradually worsening. Pain is aching and sore in his sinuses, moderate in severity. He also c/o mild chest tightness. He has used his Breo and OTC fluticasone w/o relief. He has not f/u with ENT yet but plans to do so. Denies fever, chills, n/v/d.   Past Medical History  Diagnosis Date  . Adult respiratory distress syndrome (HCC)   . Reactive airway disease   . Anxiety    Past Surgical History  Procedure Laterality Date  . Bunionectomy  04/2008    right   Family History  Problem Relation Age of Onset  . Asthma Mother   . COPD Mother   . Hypertension Father   . Hyperlipidemia Father   . Parkinsonism Father   . Alcoholism      grandfather  . Lung cancer      grandfather   Social History  Substance Use Topics  . Smoking status: Current Every Day Smoker  . Smokeless tobacco: None  . Alcohol Use: Yes    Review of Systems  Constitutional: Negative for fever and chills.  HENT: Positive for congestion, postnasal drip, rhinorrhea and sinus pressure. Negative for ear pain, sore throat, trouble swallowing and voice change.   Respiratory: Negative for cough and shortness of breath.   Cardiovascular: Negative for chest pain and palpitations.  Gastrointestinal: Negative for nausea, vomiting, abdominal pain and diarrhea.  Musculoskeletal: Negative for myalgias, back pain and arthralgias.  Skin: Negative for rash.  Neurological: Positive for headaches. Negative for dizziness and  light-headedness.    Allergies  Zoloft  Home Medications   Prior to Admission medications   Medication Sig Start Date End Date Taking? Authorizing Provider  albuterol (PROVENTIL HFA;VENTOLIN HFA) 108 (90 Base) MCG/ACT inhaler Inhale 1-2 puffs into the lungs every 6 (six) hours as needed for wheezing or shortness of breath. 01/20/16   Junius Finner, PA-C  amoxicillin-clavulanate (AUGMENTIN) 875-125 MG tablet Take 1 tablet by mouth every 12 (twelve) hours. 04/12/16   Elson Areas, PA-C  Cetirizine HCl 10 MG CAPS Take 1 capsule (10 mg total) by mouth daily. 12/20/12   Floydene Flock, MD  citalopram (CELEXA) 20 MG tablet TAKE ONE TABLET BY MOUTH ONCE DAILY 02/11/16   Laren Boom, DO  doxycycline (VIBRAMYCIN) 100 MG capsule Take 1 capsule (100 mg total) by mouth 2 (two) times daily. One po bid x 7 days 04/28/16   Junius Finner, PA-C  fluticasone [facility]) 50 MCG/ACT nasal spray Place 2 sprays into both nostrils daily. 03/17/14   Sean Hommel, DO  fluticasone furoate-vilanterol (BREO ELLIPTA) 100-25 MCG/INH AEPB One inhalation daily. 01/20/16   Junius Finner, PA-C  omeprazole (PRILOSEC) 10 MG capsule Take 10 mg by mouth daily.    Historical Provider, MD  predniSONE (DELTASONE) 10 MG tablet 6,5,4,3,2,1 taper 04/12/16   Elson Areas, PA-C  predniSONE (DELTASONE) 20 MG tablet 3 tabs po day one, then 2 po daily x 4 days 04/28/16   Junius Finner, PA-C   Meds Ordered and Administered  this Visit  Medications - No data to display  BP 132/74 mmHg  Pulse 89  Temp(Src) 98.9 F (37.2 C) (Oral)  Resp 16  Ht 5\' 7"  (1.702 m)  Wt 183 lb (83.008 kg)  BMI 28.66 kg/m2  SpO2 96% No data found.   Physical Exam  Constitutional: He appears well-developed and well-nourished.  HENT:  Head: Normocephalic and atraumatic.  Right Ear: Tympanic membrane normal.  Left Ear: Tympanic membrane normal.  Nose: Mucosal edema present. Right sinus exhibits maxillary sinus tenderness. Right sinus exhibits no frontal sinus  tenderness. Left sinus exhibits maxillary sinus tenderness. Left sinus exhibits no frontal sinus tenderness.  Mouth/Throat: Uvula is midline, oropharynx is clear and moist and mucous membranes are normal.  Eyes: Conjunctivae are normal. No scleral icterus.  Neck: Normal range of motion. Neck supple.  Cardiovascular: Normal rate, regular rhythm and normal heart sounds.   Pulmonary/Chest: Effort normal and breath sounds normal. No respiratory distress. He has no wheezes. He has no rales.  Abdominal: Soft. He exhibits no distension. There is no tenderness.  Musculoskeletal: Normal range of motion.  Neurological: He is alert.  Skin: Skin is warm and dry.  Nursing note and vitals reviewed.   ED Course  Procedures (including critical care time)  Labs Review Labs Reviewed - No data to display  Imaging Review No results found.    MDM   1. Acute recurrent maxillary sinusitis    Pt c/o recurrent sinus symptoms. Exam c/w maxillary sinusitis with sinus tenderness and maxillary sinus tenderness.   Rx: Doxycycline and prednisone  Encouraged to f/u with PCP and ENT for recurrent sinusitis.  Encouraged continued use of his Breo and fluticasone. May try sinus rinses. Patient verbalized understanding and agreement with treatment plan.     Junius FinnerErin O'Malley, PA-C 04/28/16 [phone]

## 2016-04-28 NOTE — ED Notes (Signed)
Has had nasal congestion for over 8 weeks; treated with abx and prednisone 3 weeks ago and issue resolved, now returned.

## 2016-04-30 ENCOUNTER — Telehealth: Payer: Self-pay | Admitting: Emergency Medicine

## 2016-04-30 NOTE — ED Notes (Signed)
No answer.  TMartin,CMA

## 2016-05-14 ENCOUNTER — Encounter: Payer: Self-pay | Admitting: Emergency Medicine

## 2016-05-14 ENCOUNTER — Emergency Department (INDEPENDENT_AMBULATORY_CARE_PROVIDER_SITE_OTHER)
Admission: EM | Admit: 2016-05-14 | Discharge: 2016-05-14 | Disposition: A | Discharge status: Home / Self Care | Payer: Managed Care, Other (non HMO) | Source: Home / Self Care | Attending: Family Medicine | Admitting: Family Medicine

## 2016-05-14 ENCOUNTER — Emergency Department (INDEPENDENT_AMBULATORY_CARE_PROVIDER_SITE_OTHER): Payer: Managed Care, Other (non HMO)

## 2016-05-14 DIAGNOSIS — J329 Chronic sinusitis, unspecified: Secondary | ICD-10-CM | POA: Diagnosis not present

## 2016-05-14 DIAGNOSIS — R0602 Shortness of breath: Secondary | ICD-10-CM | POA: Diagnosis not present

## 2016-05-14 DIAGNOSIS — J45909 Unspecified asthma, uncomplicated: Secondary | ICD-10-CM

## 2016-05-14 DIAGNOSIS — J0101 Acute recurrent maxillary sinusitis: Secondary | ICD-10-CM

## 2016-05-14 DIAGNOSIS — J309 Allergic rhinitis, unspecified: Secondary | ICD-10-CM | POA: Diagnosis not present

## 2016-05-14 HISTORY — DX: Chronic sinusitis, unspecified: J32.9

## 2016-05-14 MED ORDER — CEFDINIR 300 MG PO CAPS: 300.0000 mg | ORAL_CAPSULE | Freq: Two times a day (BID) | ORAL | Status: DC

## 2016-05-14 MED ORDER — MONTELUKAST SODIUM 10 MG PO TABS: 10.0000 mg | ORAL_TABLET | Freq: Every day | ORAL | Status: DC

## 2016-05-14 MED ORDER — PREDNISONE 20 MG PO TABS: ORAL_TABLET | ORAL | Status: DC

## 2016-05-14 NOTE — ED Provider Notes (Signed)
CSN: [id number]     Arrival date & time 05/14/16  [id number] History   First MD Initiated Contact with Patient 05/14/16 1011     Chief Complaint  Patient presents with  . Nasal Congestion      HPI Comments: Patient complains of 5 day history of nasal congestion, sinus pressure, chills, and malaise.  He complains of tightness in his chest and occasional wheezing. He states that he has had recurring sinus congestion for about 2 months, and his symptoms clear for only about a week before recurring.  He states that he has an appointment with ENT on 06/06/16. His family history includes asthma in his mother.  The history is provided by the patient.    Past Medical History  Diagnosis Date  . Adult respiratory distress syndrome (HCC)   . Reactive airway disease   . Anxiety   . Frequent sinus infections    Past Surgical History  Procedure Laterality Date  . Bunionectomy  04/2008    right   Family History  Problem Relation Age of Onset  . Asthma Mother   . COPD Mother   . Hypertension Father   . Hyperlipidemia Father   . Parkinsonism Father   . Alcoholism      grandfather  . Lung cancer      grandfather   Social History  Substance Use Topics  . Smoking status: Current Every Day Smoker  . Smokeless tobacco: None  . Alcohol Use: Yes    Review of Systems No sore throat No cough No pleuritic pain + occasional wheezing + nasal congestion + post-nasal drainage + sinus pain/pressure No itchy/red eyes No earache No hemoptysis No SOB No fever, + chills No nausea No vomiting No abdominal pain No diarrhea No urinary symptoms No skin rash + fatigue No myalgias + headache Used OTC meds without relief  Allergies  Zoloft  Home Medications   Prior to Admission medications   Medication Sig Start Date End Date Taking? Authorizing Provider  albuterol (PROVENTIL HFA;VENTOLIN HFA) 108 (90 Base) MCG/ACT inhaler Inhale 1-2 puffs into the lungs every 6 (six) hours as needed for  wheezing or shortness of breath. 01/20/16   Junius FinnerErin O'Malley, PA-C  cefdinir (OMNICEF) 300 MG capsule Take 1 capsule (300 mg total) by mouth 2 (two) times daily. 05/14/16   Lattie HawStephen A Ashdon Gillson, MD  Cetirizine HCl 10 MG CAPS Take 1 capsule (10 mg total) by mouth daily. 12/20/12   Floydene FlockSteven J Newton, MD  citalopram (CELEXA) 20 MG tablet TAKE ONE TABLET BY MOUTH ONCE DAILY 02/11/16   Sean Hommel, DO  fluticasone (FLONASE) 50 MCG/ACT nasal spray Place 2 sprays into both nostrils daily. 03/17/14   Sean Hommel, DO  fluticasone furoate-vilanterol (BREO ELLIPTA) 100-25 MCG/INH AEPB One inhalation daily. 01/20/16   Junius FinnerErin O'Malley, PA-C  montelukast (SINGULAIR) 10 MG tablet Take 1 tablet (10 mg total) by mouth at bedtime. 05/14/16   Lattie HawStephen A Delani Kohli, MD  omeprazole (PRILOSEC) 10 MG capsule Take 10 mg by mouth daily.    Historical Provider, MD  predniSONE (DELTASONE) 20 MG tablet Take one tab by mouth twice daily for 5 days, then one daily for 3 days. Take with food. 05/14/16   Lattie HawStephen A Ason Heslin, MD   Meds Ordered and Administered this Visit  Medications - No data to display  BP 128/78 mmHg  Pulse 83  Temp(Src) 98.1 F (36.7 C) (Oral)  Resp 18  Wt 185 lb (83.915 kg)  SpO2 94% No data found.   Physical  Exam Nursing notes and Vital Signs reviewed. Appearance:  Patient appears stated age, and in no acute distress Eyes:  Pupils are equal, round, and reactive to light and accomodation.  Extraocular movement is intact.  Conjunctivae are not inflamed  Ears:  Canals normal.  Tympanic membranes normal.  Nose:  Congested turbinates.  There is tenderness to palpation over bridge of nose, and maxillary sinuses. Pharynx:  Normal Neck:  Supple.  Tender enlarged posterior/lateral nodes are palpated bilaterally  Lungs:  Clear to auscultation.  Breath sounds are equal.  Moving air well. Heart:  Regular rate and rhythm without murmurs, rubs, or gallops.  Abdomen:  Nontender without masses or hepatosplenomegaly.  Bowel sounds are  present.  No CVA or flank tenderness.  Extremities:  No edema.  Skin:  No rash present.   ED Course  Procedures none  Imaging Review Dg Sinuses Complete  05/14/2016  CLINICAL DATA:  Pt states he has had bi-lateral sinus pressure x 4 weeks. Hx of sinus infections. EXAM: PARANASAL SINUSES - COMPLETE 3 + VIEW COMPARISON:  None. FINDINGS: The paranasal sinus are aerated. There is no evidence of sinus opacification air-fluid levels or mucosal thickening. No significant bone abnormalities are seen. IMPRESSION: Negative. Electronically Signed   By: Corlis Leak M.D.   On: 05/14/2016 11:10   Dg Chest 2 View  05/14/2016  CLINICAL DATA:  Chronic shortness of breath EXAM: CHEST  2 VIEW COMPARISON:  December 20, 2012 FINDINGS: The heart size and mediastinal contours are within normal limits. Both lungs are clear. The visualized skeletal structures are unremarkable. IMPRESSION: No active cardiopulmonary disease. Electronically Signed   By: Gerome Sam III M.D   On: 05/14/2016 10:48      MDM   1. Allergic rhinitis, unspecified allergic rhinitis type   2. Acute recurrent maxillary sinusitis?   3. Mild reactive airways disease    Begin trial of Singulair. Begin Omnicef  BID and prednisone burst/taper. May add Pseudoephedrine ( , one or two every 4 to 6 hours) for sinus congestion.  Get adequate rest.   May use Afrin nasal spray (or generic oxymetazoline) twice daily for about 5 days and then discontinue.  Also recommend using saline nasal spray several times daily and saline nasal irrigation (AYR is a common brand).  Use Flonase nasal spray each evening after using Afrin nasal spray and saline nasal irrigation. Continue all inhalers as prescribed.  Continue Zyrtec. Followup with ENT as scheduled.    Lattie Haw, MD 05/22/16 [phone]

## 2016-05-14 NOTE — ED Notes (Signed)
Patient has return of nasal congestion, sinus pressure and feeling of illness; this is 3rd instance of these symptoms in past 4 weeks; has only been clear of symptoms one week prior to onset of next occurrence. He has ENT appt. On 06-06-16 but cannot wait that long for treatment.

## 2016-05-14 NOTE — Discharge Instructions (Signed)
May add Pseudoephedrine (30mg , one or two every 4 to 6 hours) for sinus congestion.  Get adequate rest.   May use Afrin nasal spray (or generic oxymetazoline) twice daily for about 5 days and then discontinue.  Also recommend using saline nasal spray several times daily and saline nasal irrigation (AYR is a common brand).  Use Flonase nasal spray each evening after using Afrin nasal spray and saline nasal irrigation. Continue all inhalers as prescribed.  Continue Zyrtec.

## 2016-05-25 ENCOUNTER — Encounter: Payer: Self-pay | Admitting: Family Medicine

## 2016-05-25 ENCOUNTER — Ambulatory Visit (INDEPENDENT_AMBULATORY_CARE_PROVIDER_SITE_OTHER): Payer: Managed Care, Other (non HMO) | Admitting: Family Medicine

## 2016-05-25 VITALS — BP 142/100 | HR 71 | Wt 182.0 lb

## 2016-05-25 DIAGNOSIS — J4530 Mild persistent asthma, uncomplicated: Secondary | ICD-10-CM

## 2016-05-25 DIAGNOSIS — F329 Major depressive disorder, single episode, unspecified: Secondary | ICD-10-CM | POA: Diagnosis not present

## 2016-05-25 DIAGNOSIS — K5900 Constipation, unspecified: Secondary | ICD-10-CM

## 2016-05-25 DIAGNOSIS — F32A Depression, unspecified: Secondary | ICD-10-CM

## 2016-05-25 MED ORDER — MONTELUKAST SODIUM 10 MG PO TABS: 10.0000 mg | ORAL_TABLET | Freq: Every day | ORAL | Status: DC

## 2016-05-25 MED ORDER — FLUTICASONE FUROATE-VILANTEROL 100-25 MCG/INH IN AEPB: INHALATION_SPRAY | RESPIRATORY_TRACT | Status: DC

## 2016-05-25 MED ORDER — BUPROPION HCL ER (XL) 150 MG PO TB24: 150.0000 mg | ORAL_TABLET | Freq: Every day | ORAL | Status: DC

## 2016-05-25 NOTE — Progress Notes (Signed)
CC: Grant Peterson is a 45 y.o. male is here for Depression; Weight Gain; URI; and Constipation   Subjective: HPI:  Follow-up asthma: He's having to pay close $200 to get Brio which is complicating compliance. He believes it helps when he is able to use it. He also thinks that the Singulair that was prescribed the other week is helping reduce his wheezing. He currently denies any cough shortness of breath wheezing or chest discomfort.  He's been experiencing some constipation where he'll go 1-2 days without having a bowel movement. He also feels bloated lower in the abdomen. Pain is temporarily improved after bowel movement. No benefit from over-the-counter laxatives, he forgets the name of what exactly he's tried so far. He denies any blood in stool or black appearance to his stool. He denies change in caliber of his stool. no diarrhea. No nausea or vomiting  He doesn't doesn't think that citalopram is working as well as he had hoped. He finds that he gets moody and subjectively depressed when he stays with stress. 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 He self medicates with alcohol in the evenings. No other recreational drug use. No other mental disturbance other than that described above. Symptoms are currently mild severity but interfering with quality of life.   Review Of Systems Outlined In HPI  Past Medical History  Diagnosis Date  . Adult respiratory distress syndrome (HCC)   . Reactive airway disease   . Anxiety   . Frequent sinus infections     Past Surgical History  Procedure Laterality Date  . Bunionectomy  04/2008    right   Family History  Problem Relation Age of Onset  . Asthma Mother   . COPD Mother   . Hypertension Father   . Hyperlipidemia Father   . Parkinsonism Father   . Alcoholism      grandfather  . Lung cancer      grandfather    Social History   Social History  . Marital Status:  Single    Spouse Name: N/A  . Number of Children: N/A  . Years of Education: N/A   Occupational History  . Not on file.   Social History Main Topics  . Smoking status: Current Every Day Smoker  . Smokeless tobacco: Not on file  . Alcohol Use: Yes  . Drug Use: No  . Sexual Activity:    Partners: Female   Other Topics Concern  . Not on file   Social History Narrative     Objective: BP 142/100 mmHg  Pulse 71  Wt 182 lb (82.555 kg)  General: Alert and Oriented, No Acute Distress HEENT: Pupils equal, round, reactive to light. Conjunctivae clear. Moist mucous membranes Lungs: Clear to auscultation bilaterally, no wheezing/ronchi/rales.  Comfortable work of breathing. Good air movement. Cardiac: Regular rate and rhythm. Normal S1/S2.  No murmurs, rubs, nor gallops.   Extremities: No peripheral edema.  Strong peripheral pulses.  Mental Status: No depression, anxiety, nor agitation. Skin: Warm and dry.  Assessment & Plan: Aneta Minshillip was seen today for depression, weight gain, uri and constipation.  Diagnoses and all orders for this visit:  Asthma, chronic, mild persistent, uncomplicated -     fluticasone furoate-vilanterol (BREO ELLIPTA) 100-25 MCG/INH AEPB; One inhalation daily.  Depression  Constipation, unspecified constipation type  Other orders -     Discontinue: montelukast (SINGULAIR) 10 MG tablet; Take 1 tablet (10 mg  total) by mouth at bedtime. -     buPROPion (WELLBUTRIN XL) 150 MG 24 hr tablet; Take 1 tablet (150 mg total) by mouth daily. -     montelukast (SINGULAIR) 10 MG tablet; Take 1 tablet (10 mg total) by mouth at bedtime.   Asthma: Currently controlled during today's visit however I think that he could avoid exacerbations if he's able to get breo at a better price. I gave him a savings voucher today to keep his monthly payment only $10, continue Singulair Depression: Uncontrolled chronic condition, switching from citalopram to Wellbutrin, using Wellbutrin  in hopes of helping him lose weight. Constipation: Encouraged him to start taking 2 heaping teaspoons of psyllium powder with water every night before bed   Return for 1-2 months for mood.

## 2016-06-27 ENCOUNTER — Ambulatory Visit: Payer: Managed Care, Other (non HMO) | Admitting: Family Medicine

## 2016-11-09 ENCOUNTER — Encounter: Payer: Self-pay | Admitting: Emergency Medicine

## 2016-11-09 ENCOUNTER — Emergency Department (INDEPENDENT_AMBULATORY_CARE_PROVIDER_SITE_OTHER)
Admission: EM | Admit: 2016-11-09 | Discharge: 2016-11-09 | Disposition: A | Discharge status: Home / Self Care | Payer: Managed Care, Other (non HMO) | Source: Home / Self Care | Attending: Family Medicine | Admitting: Family Medicine

## 2016-11-09 DIAGNOSIS — S161XXA Strain of muscle, fascia and tendon at neck level, initial encounter: Secondary | ICD-10-CM

## 2016-11-09 DIAGNOSIS — M62838 Other muscle spasm: Secondary | ICD-10-CM

## 2016-11-09 MED ORDER — PREDNISONE 20 MG PO TABS: ORAL_TABLET | ORAL | 0 refills | Status: DC

## 2016-11-09 MED ORDER — IBUPROFEN 600 MG PO TABS: 600.0000 mg | ORAL_TABLET | Freq: Four times a day (QID) | ORAL | 0 refills | Status: AC | PRN

## 2016-11-09 MED ORDER — KETOROLAC TROMETHAMINE 60 MG/2ML IM SOLN
60.0000 mg | Freq: Once | INTRAMUSCULAR | Status: AC
  Administered 2016-11-09: 60 mg via INTRAMUSCULAR

## 2016-11-09 MED ORDER — DEXAMETHASONE SODIUM PHOSPHATE 10 MG/ML IJ SOLN
10.0000 mg | Freq: Once | INTRAMUSCULAR | Status: AC
  Administered 2016-11-09: 10 mg via INTRAMUSCULAR

## 2016-11-09 MED ORDER — CYCLOBENZAPRINE HCL 10 MG PO TABS: 10.0000 mg | ORAL_TABLET | Freq: Two times a day (BID) | ORAL | 0 refills | Status: DC | PRN

## 2016-11-09 NOTE — Discharge Instructions (Signed)
°  You were given a shot of decadron (a steroid) today to help with inflammation and pain in your muscles.  You have been prescribed 5 days of prednisone, an oral steroid.  You may start this medication tomorrow with breakfast.  Flexeril is a muscle relaxer and may cause drowsiness. Do not drink alcohol, drive, or operate heavy machinery while taking.

## 2016-11-09 NOTE — ED Provider Notes (Signed)
CSN: [id number]     Arrival date & time 11/09/16  1541 History   First MD Initiated Contact with Patient 11/09/16 1559     Chief Complaint  Patient presents with  . Neck Injury   (Consider location/radiation/quality/duration/timing/severity/associated sxs/prior Treatment) HPI Grant Peterson is a 45 y.o. 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  Pt worse for EMS. Pain started the next day, yesterday, when he woke up.  He notes his neck felt stiff and sore. Pain is aching and sore, burning and sharp at times with certain head movements.  Pain is 8/10 at worst. Denies radiation of pain or numbness down his arms. No other injuries. No prior neck or back injuries.    Past Medical History:  Diagnosis Date  . Adult respiratory distress syndrome (HCC)   . Anxiety   . Frequent sinus infections   . Reactive airway disease    Past Surgical History:  Procedure Laterality Date  . BUNIONECTOMY  04/2008   right   Family History  Problem Relation Age of Onset  . Asthma Mother   . COPD Mother   . Hypertension Father   . Hyperlipidemia Father   . Parkinsonism Father   . Alcoholism      grandfather  . Lung cancer      grandfather   Social History  Substance Use Topics  . Smoking status: Current Every Day Smoker  . Smokeless tobacco: Never Used  . Alcohol use Yes    Review of Systems  Musculoskeletal: Positive for myalgias, neck pain and neck stiffness. Negative for arthralgias and back pain.  Skin: Negative for color change, rash and wound.  Neurological: Negative for weakness and numbness.    Allergies  Zoloft [sertraline hcl]  Home Medications   Prior to Admission medications   Medication Sig Start Date End Date Taking? Authorizing Provider  albuterol (PROVENTIL HFA;VENTOLIN HFA) 108 (90 Base) MCG/ACT inhaler Inhale 1-2 puffs into the lungs every 6 (six) hours as  needed for wheezing or shortness of breath. 01/20/16   Junius FinnerErin O'Malley, PA-C  buPROPion (WELLBUTRIN XL) 150 MG 24 hr tablet Take 1 tablet (150 mg total) by mouth daily. 05/25/16   Laren BoomSean Hommel, DO  Cetirizine HCl 10 MG CAPS Take 1 capsule (10 mg total) by mouth daily. 12/20/12   Floydene FlockSteven J Newton, MD  cyclobenzaprine (FLEXERIL) 10 MG tablet Take 1 tablet (10 mg total) by mouth 2 (two) times daily as needed. 11/09/16   Junius FinnerErin O'Malley, PA-C  fluticasone (FLONASE) 50 MCG/ACT nasal spray Place 2 sprays into both nostrils daily. 03/17/14   Sean Hommel, DO  fluticasone furoate-vilanterol (BREO ELLIPTA) 100-25 MCG/INH AEPB One inhalation daily. 05/25/16   Sean Hommel, DO  ibuprofen (ADVIL,MOTRIN) 600 MG tablet Take 1 tablet (600 mg total) by mouth every 6 (six) hours as needed. 11/09/16   Junius FinnerErin O'Malley, PA-C  montelukast (SINGULAIR) 10 MG tablet Take 1 tablet (10 mg total) by mouth at bedtime. 05/25/16   Sean Hommel, DO  omeprazole (PRILOSEC) 10 MG capsule Take 10 mg by mouth daily.    Historical Provider, MD  predniSONE (DELTASONE) 20 MG tablet 3 tabs po day one, then 2 po daily x 4 days 11/09/16   Junius FinnerErin O'Malley, PA-C   Meds Ordered and Administered this Visit   Medications  ketorolac (TORADOL) injection 60 mg (60 mg Intramuscular Given 11/09/16 1616)  dexamethasone (DECADRON) injection 10 mg (10  mg Intramuscular Given 11/09/16 1617)    BP 143/93 (BP Location: Left Arm)   Pulse 78   Temp 98.1 F (36.7 C) (Oral)   Ht 5\' 6"  (1.676 m)   Wt 175 lb (79.4 kg)   SpO2 96%   BMI 28.25 kg/m  No data found.   Physical Exam  Constitutional: He is oriented to person, place, and time. He appears well-developed and well-nourished.  HENT:  Head: Normocephalic and atraumatic.  Eyes: EOM are normal.  Neck: Neck supple.  No midline spinal tenderness. Tenderness to Left and Right side cervical muscles, worse on Right side.  Limited ROM due to pain and stiffness.   Cardiovascular: Normal rate.   Pulses:      Radial  pulses are 2+ on the right side, and 2+ on the left side.  Pulmonary/Chest: Effort normal.  Musculoskeletal: Normal range of motion. He exhibits tenderness. He exhibits no edema.  No midline spinal tenderness. Tenderness to Right upper trapezius muscle. Full ROM upper extremities with 5/5 strength bilaterally.   Neurological: He is alert and oriented to person, place, and time.  Skin: Skin is warm and dry. No rash noted. No erythema.  Psychiatric: He has a normal mood and affect. His behavior is normal.  Nursing note and vitals reviewed.   Urgent Care Course   Clinical Course     Procedures (including critical care time)  Labs Review Labs Reviewed - No data to display  Imaging Review No results found.    MDM   1. Neck muscle strain, initial encounter   2. 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  No prior neck surgeries or issues. No radiating pain or numbness into upper extremities. No bony tenderness.  Pain likely due to muscle spasm from muscle strain. Tx in UC: Toradol 60mg  IM and Decadron 10mg  IM  Rx: Prednisone, Flexeril and ibuprofen Home care instructions provided. Woke note provided for tomorrow off.  F/u with PCP in 1 week if not improving.    Junius Finnerrin O'Malley, PA-C 11/09/16 [phone]

## 2016-11-09 NOTE — ED Triage Notes (Signed)
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

## 2016-11-15 ENCOUNTER — Encounter: Payer: Managed Care, Other (non HMO) | Admitting: Family Medicine

## 2016-11-21 ENCOUNTER — Ambulatory Visit (INDEPENDENT_AMBULATORY_CARE_PROVIDER_SITE_OTHER): Payer: Managed Care, Other (non HMO) | Admitting: Osteopathic Medicine

## 2016-11-21 ENCOUNTER — Encounter: Payer: Self-pay | Admitting: Osteopathic Medicine

## 2016-11-21 VITALS — BP 133/80 | HR 90 | Ht 67.0 in | Wt 180.0 lb

## 2016-11-21 DIAGNOSIS — R202 Paresthesia of skin: Secondary | ICD-10-CM

## 2016-11-21 DIAGNOSIS — F32A Depression, unspecified: Secondary | ICD-10-CM

## 2016-11-21 DIAGNOSIS — F329 Major depressive disorder, single episode, unspecified: Secondary | ICD-10-CM | POA: Diagnosis not present

## 2016-11-21 DIAGNOSIS — T1490XA Injury, unspecified, initial encounter: Secondary | ICD-10-CM

## 2016-11-21 MED ORDER — BUPROPION HCL ER (XL) 300 MG PO TB24: 300.0000 mg | ORAL_TABLET | Freq: Every day | ORAL | 2 refills | Status: DC
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
## 2016-12-07 ENCOUNTER — Encounter: Payer: Self-pay | Admitting: Family Medicine

## 2016-12-07 ENCOUNTER — Ambulatory Visit (INDEPENDENT_AMBULATORY_CARE_PROVIDER_SITE_OTHER): Payer: Managed Care, Other (non HMO) | Admitting: Family Medicine

## 2016-12-07 DIAGNOSIS — M5412 Radiculopathy, cervical region: Secondary | ICD-10-CM

## 2016-12-07 NOTE — Patient Instructions (Signed)
Thank you for coming in today. Return following MRI.   Come back or go to the emergency room if you notice new weakness new numbness problems walking or bowel or bladder problems.   Cervical Radiculopathy Introduction Cervical radiculopathy happens when a nerve in the neck (cervical nerve) is pinched or bruised. This condition can develop because of an injury or as part of the normal aging process. Pressure on the cervical nerves can cause pain or numbness that runs from the neck all the way down into the arm and fingers. Usually, this condition gets better with rest. Treatment may be needed if the condition does not improve. What are the causes? This condition may be caused by:  Injury.  Slipped (herniated) disk.  Muscle tightness in the neck because of overuse.  Arthritis.  Breakdown or degeneration in the bones and joints of the spine (spondylosis) due to aging.  Bone spurs that may develop near the cervical nerves. What are the signs or symptoms? Symptoms of this condition include:  Pain that runs from the neck to the arm and hand. The pain can be severe or irritating. It may be worse when the neck is moved.  Numbness or weakness in the affected arm and hand. How is this diagnosed? This condition may be diagnosed based on symptoms, medical history, and a physical exam. You may also have tests, including:  X-rays.  CT scan.  MRI.  Electromyogram (EMG).  Nerve conduction tests. How is this treated? In many cases, treatment is not needed for this condition. With rest, the condition usually gets better over time. If treatment is needed, options may include:  Wearing a soft neck collar for short periods of time.  Physical therapy to strengthen your neck muscles.  Medicines, such as NSAIDs, oral corticosteroids, or spinal injections.  Surgery. This may be needed if other treatments do not help. Various types of surgery may be done depending on the cause of your  problems. Follow these instructions at home: Managing pain  Take over-the-counter and prescription medicines only as told by your health care provider.  If directed, apply ice to the affected area.  Put ice in a plastic bag.  Place a towel between your skin and the bag.  Leave the ice on for 20 minutes, 2-3 times per day.  If ice does not help, you can try using heat. Take a warm shower or warm bath, or use a heat pack as told by your health care provider.  Try a gentle neck and shoulder massage to help relieve symptoms. Activity  Rest as needed. Follow instructions from your health care provider about any restrictions on activities.  Do stretching and strengthening exercises as told by your health care provider or physical therapist. General instructions  If you were given a soft collar, wear it as told by your health care provider.  Use a flat pillow when you sleep.  Keep all follow-up visits as told by your health care provider. This is important. Contact a health care provider if:  Your condition does not improve with treatment. Get help right away if:  Your pain gets much worse and cannot be controlled with medicines.  You have weakness or numbness in your hand, arm, face, or leg.  You have a high fever.  You have a stiff, rigid neck.  You lose control of your bowels or your bladder (have incontinence).  You have trouble with walking, balance, or speaking. This information is not intended to replace advice given to  you by your health care provider. Make sure you discuss any questions you have with your health care provider. Document Released: 08/16/2001 Document Revised: 04/28/2016 Document Reviewed: 01/15/2015  2017 Elsevier

## 2016-12-07 NOTE — Progress Notes (Signed)
   Subjective:    I'm seeing this patient as a consultation for:  Sunnie NielsenNatalie Alexander, DO  and Junius FinnerErin O'Malley PA-C  CC: Cervical Radiculopathy  HPI: Patient sustained an injury at work in early February. 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 The pain worsened and he was seen in urgent care on 11/09/2016. At that time he was prescribed prednisone and given a Toradol shot as well as Flexeril. He was subsequently seen at his employer's occupational health clinic where he complained of cervical radicular symptoms. X-ray showed cervical spasm and he was prescribed Valium and low-back. He notes the neck pain itself has essentially resolved. However he notes continued symptoms into his hand. He notes burning and tingling sensation along with decreased sensation to the dorsal aspect of his hand at the second and third digits. Additionally he notes subjective hand weakness and clumsiness. He has trouble handwriting and starting an IV. 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 Additionally he notes right arm weakness and difficulty with lifting. He is unable to work currently.  Past medical history, Surgical history, Family history not pertinant except as noted below, Social history, Allergies, and medications have been entered into the medical record, reviewed, and no changes needed.   Review of Systems: No headache, visual changes, nausea, vomiting, diarrhea, constipation, dizziness, abdominal pain, skin rash, fevers, chills, night sweats, weight loss, swollen lymph nodes, body aches, joint swelling, muscle aches, chest pain, shortness of breath, mood changes, visual or auditory hallucinations.   Objective:    Vitals:   12/07/16 1054  BP: (!) 135/94  Pulse: 87   General: Well Developed, well nourished, and in no acute distress.  Neuro/Psych: Alert and oriented x3, extra-ocular muscles intact, able to move all 4 extremities, sensation grossly  intact. Skin: Warm and dry, no rashes noted.  Respiratory: Not using accessory muscles, speaking in full sentences, trachea midline.  Cardiovascular: Pulses palpable, no extremity edema. Abdomen: Does not appear distended. MSK: C-spine is nontender. Decreased motion of her patient experienced pain with left lateral flexion. Upper extremity strength C5 intact bilaterally C6 decreased right 4+/5 compared to left C7 decreased 4/5 right compared to left C8 intact bilaterally Reflexes are intact throughout. Decreased sensation right dorsal hand compared to left    No results found for this or any previous visit (from the past 24 hour(s)). No results found.  Impression and Recommendations:    Assessment and Plan: 46 y.o. male with Cervical radiculopathy likely involving right C6 or C7 nerve roots.  Patient is failing conservative management and has symptoms that are interfering with his ability to work. He has definitive weakness on physical exam today. Plan to obtain MRI for further planning an epidural steroid injection planning. Follow-up after MRI.   Discussed warning signs or symptoms. Please see discharge instructions. Patient expresses understanding.

## 2016-12-12 ENCOUNTER — Ambulatory Visit (INDEPENDENT_AMBULATORY_CARE_PROVIDER_SITE_OTHER): Payer: Managed Care, Other (non HMO)

## 2016-12-12 DIAGNOSIS — M5412 Radiculopathy, cervical region: Secondary | ICD-10-CM

## 2016-12-12 DIAGNOSIS — M479 Spondylosis, unspecified: Secondary | ICD-10-CM | POA: Diagnosis not present

## 2016-12-12 DIAGNOSIS — M50123 Cervical disc disorder at C6-C7 level with radiculopathy: Secondary | ICD-10-CM | POA: Diagnosis not present

## 2016-12-14 ENCOUNTER — Telehealth: Payer: Self-pay | Admitting: Family Medicine

## 2016-12-14 DIAGNOSIS — M5412 Radiculopathy, cervical region: Secondary | ICD-10-CM

## 2016-12-14 NOTE — Telephone Encounter (Signed)
MRI shows significant compression of the right C7 nerve root. This makes sense for the location of your pain.  I have ordered the epidural steroid injection we discussed.  Please let me know if you do not hear anything.

## 2016-12-16 NOTE — Telephone Encounter (Signed)
[facility] and demographics [facility] at [phone].  Pt notified and advised to FU after injection.

## 2016-12-26 ENCOUNTER — Ambulatory Visit
Admission: RE | Admit: 2016-12-26 | Discharge: 2016-12-26 | Disposition: A | Discharge status: Home / Self Care | Payer: Managed Care, Other (non HMO) | Source: Ambulatory Visit | Attending: Family Medicine | Admitting: Family Medicine

## 2016-12-26 MED ORDER — TRIAMCINOLONE ACETONIDE 40 MG/ML IJ SUSP (RADIOLOGY)
60.0000 mg | Freq: Once | INTRAMUSCULAR | Status: AC
  Administered 2016-12-26: 60 mg via EPIDURAL

## 2016-12-26 MED ORDER — IOPAMIDOL (ISOVUE-M 300) INJECTION 61%
1.0000 mL | Freq: Once | INTRAMUSCULAR | Status: AC | PRN
  Administered 2016-12-26: 1 mL via EPIDURAL

## 2016-12-26 NOTE — Discharge Instructions (Signed)

## 2017-01-05 ENCOUNTER — Encounter: Payer: Self-pay | Admitting: Family Medicine

## 2017-01-05 ENCOUNTER — Ambulatory Visit (INDEPENDENT_AMBULATORY_CARE_PROVIDER_SITE_OTHER): Payer: Managed Care, Other (non HMO) | Admitting: Family Medicine

## 2017-01-05 VITALS — BP 127/82 | HR 69 | Wt 180.0 lb

## 2017-01-05 DIAGNOSIS — M5412 Radiculopathy, cervical region: Secondary | ICD-10-CM

## 2017-01-05 NOTE — Progress Notes (Signed)
Grant Peterson is a 46 y.o. male who presents to [facility] today for follow-up right cervical radiculopathy. Patient was seen about a month ago for right cervical radiculopathy and subsequently had MRI and epidural steroid injection. He notes he is feeling much better and is ready to return to work. He notes improving strength and thinks is about back to his baseline almost. He denies significant radiating pain weakness or numbness or paresthesias.   Past Medical History:  Diagnosis Date  . Adult respiratory distress syndrome (HCC)   . Anxiety   . Frequent sinus infections   . Reactive airway disease    Past Surgical History:  Procedure Laterality Date  . BUNIONECTOMY  04/2008   right   Social History  Substance Use Topics  . Smoking status: Current Every Day Smoker  . Smokeless tobacco: Never Used  . Alcohol use Yes     ROS:  As above   Medications: Current Outpatient Prescriptions  Medication Sig Dispense Refill  . albuterol (PROVENTIL HFA;VENTOLIN HFA) 108 (90 Base) MCG/ACT inhaler Inhale 1-2 puffs into the lungs every 6 (six) hours as needed for wheezing or shortness of breath. 1 Inhaler 0  . buPROPion (WELLBUTRIN XL) 300 MG 24 hr tablet Take 1 tablet (300 mg total) by mouth daily. 30 tablet 2  . Cetirizine HCl 10 MG CAPS Take 1 capsule (10 mg total) by mouth daily. 30 capsule 3  . fluticasone (FLONASE) 50 MCG/ACT nasal spray Place 2 sprays into both nostrils daily. 16 g 12  . fluticasone furoate-vilanterol (BREO ELLIPTA) 100-25 MCG/INH AEPB One inhalation daily. 60 each 5  . ibuprofen (ADVIL,MOTRIN) 600 MG tablet Take 1 tablet (600 mg total) by mouth every 6 (six) hours as needed. 30 tablet 0  . meloxicam (MOBIC) 15 MG tablet Take 15 mg by mouth daily.    . montelukast (SINGULAIR) 10 MG tablet Take 1 tablet (10 mg total) by mouth at bedtime. 30 tablet 3  . omeprazole (PRILOSEC) 10 MG capsule Take 10 mg by mouth daily.      No current facility-administered medications for this visit.    Allergies  Allergen Reactions  . Zoloft [Sertraline Hcl] Other (See Comments)    Flat affect     Exam:  BP 127/82   Pulse 69   Wt 180 lb (81.6 kg)   BMI 28.19 kg/m  General: Well Developed, well nourished, and in no acute distress.  Neuro/Psych: Alert and oriented x3, extra-ocular muscles intact, able to move all 4 extremities, sensation grossly intact. Skin: Warm and dry, no rashes noted.  Respiratory: Not using accessory muscles, speaking in full sentences, trachea midline.  Cardiovascular: Pulses palpable, no extremity edema. Abdomen: Does not appear distended. MSK: C-spine: Nontender to midline normal neck motion. Upper extremity strength is equal and normal throughout with the exception of very minimal triceps extension weakness 4+/5 right compared to 5/5 left. Otherwise strength is intact throughout. Sensation and pulses are intact throughout.    No results found for this or any previous visit (from the past 48 hour(s)). No results found.    Assessment and Plan: 46 y.o. male with significantly improved cervical radiculopathy following an epidural steroid injection. I feel the patient is safe and ready to return to work. Work note Provided. I do think he would benefit from just a little bit of physical therapy as well. Plan to refer. Return as needed.    Orders Placed This Encounter  Procedures  . Ambulatory referral to  Physical Therapy    Referral Priority:   Routine    Referral Type:   Physical Medicine    Referral Reason:   Specialty Services Required    Requested Specialty:   Physical Therapy    Number of Visits Requested:   1    Discussed warning signs or symptoms. Please see discharge instructions. Patient expresses understanding.

## 2017-01-05 NOTE — Patient Instructions (Signed)
Thank you for coming in today. I think you can return to work full time.   Attend PT.   Recheck as needed.

## 2017-01-11 ENCOUNTER — Ambulatory Visit: Payer: Managed Care, Other (non HMO) | Admitting: Physical Therapy

## 2017-03-30 ENCOUNTER — Ambulatory Visit (INDEPENDENT_AMBULATORY_CARE_PROVIDER_SITE_OTHER): Payer: Managed Care, Other (non HMO)

## 2017-03-30 ENCOUNTER — Ambulatory Visit (INDEPENDENT_AMBULATORY_CARE_PROVIDER_SITE_OTHER): Payer: Managed Care, Other (non HMO) | Admitting: Osteopathic Medicine

## 2017-03-30 VITALS — BP 140/88 | HR 100 | Ht 67.0 in | Wt 182.0 lb

## 2017-03-30 DIAGNOSIS — R042 Hemoptysis: Secondary | ICD-10-CM

## 2017-03-30 DIAGNOSIS — Z779 Other contact with and (suspected) exposures hazardous to health: Secondary | ICD-10-CM
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
## 2017-04-07 ENCOUNTER — Ambulatory Visit (INDEPENDENT_AMBULATORY_CARE_PROVIDER_SITE_OTHER): Payer: Managed Care, Other (non HMO)

## 2017-04-07 DIAGNOSIS — R042 Hemoptysis: Secondary | ICD-10-CM | POA: Diagnosis not present

## 2017-04-07 MED ORDER — IOPAMIDOL (ISOVUE-300) INJECTION 61%
100.0000 mL | Freq: Once | INTRAVENOUS | Status: AC | PRN
  Administered 2017-04-07: 100 mL via INTRAVENOUS

## 2017-04-12 NOTE — Addendum Note (Signed)
Addended by: Deirdre PippinsALEXANDER, Renda Pohlman M on: 04/12/2017 01:07 PM   Modules accepted: Orders

## 2017-04-17 ENCOUNTER — Encounter: Payer: Self-pay | Admitting: Emergency Medicine

## 2017-04-17 ENCOUNTER — Emergency Department (INDEPENDENT_AMBULATORY_CARE_PROVIDER_SITE_OTHER)
Admission: EM | Admit: 2017-04-17 | Discharge: 2017-04-17 | Disposition: A | Discharge status: Home / Self Care | Payer: Managed Care, Other (non HMO) | Source: Home / Self Care | Attending: Family Medicine | Admitting: Family Medicine

## 2017-04-17 DIAGNOSIS — J069 Acute upper respiratory infection, unspecified: Secondary | ICD-10-CM | POA: Diagnosis not present

## 2017-04-17 DIAGNOSIS — B9789 Other viral agents as the cause of diseases classified elsewhere: Secondary | ICD-10-CM | POA: Diagnosis not present

## 2017-04-17 MED ORDER — BENZONATATE 100 MG PO CAPS: 100.0000 mg | ORAL_CAPSULE | Freq: Three times a day (TID) | ORAL | 0 refills | Status: DC

## 2017-04-17 MED ORDER — PREDNISONE 20 MG PO TABS: ORAL_TABLET | ORAL | 0 refills | Status: DC

## 2017-04-17 NOTE — ED Provider Notes (Signed)
CSN: [id number]     Arrival date & time 04/17/17  1436 History   First MD Initiated Contact with Patient 04/17/17 1501     Chief Complaint  Patient presents with  . Sinus Problem   (Consider location/radiation/quality/duration/timing/severity/associated sxs/prior Treatment) HPI  Grant Peterson is a 46 y.o. male presenting to UC with c/o 4 days of sinus congestion, dry cough and headache.  He has used OTC Fluticasone and intermittent ibuprofen with temporary relief.  Denies fever, chills, n/v/d. Denies chest pain or SOB.  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    Past Medical History:  Diagnosis Date  . Adult respiratory distress syndrome (HCC)   . Anxiety   . Frequent sinus infections   . Reactive airway disease    Past Surgical History:  Procedure Laterality Date  . BUNIONECTOMY  04/2008   right   Family History  Problem Relation Age of Onset  . Asthma Mother   . COPD Mother   . Hypertension Father   . Hyperlipidemia Father   . Parkinsonism Father   . Alcoholism Unknown        grandfather  . Lung cancer Unknown        grandfather   Social History  Substance Use Topics  . Smoking status: Current Every Day Smoker  . Smokeless tobacco: Never Used  . Alcohol use Yes    Review of Systems  Constitutional: Negative for chills and fever.  HENT: Positive for congestion, postnasal drip, rhinorrhea and sinus pain. Negative for ear pain, sore throat, trouble swallowing and voice change.   Respiratory: Positive for cough. Negative for shortness of breath.   Cardiovascular: Negative for chest pain and palpitations.  Gastrointestinal: Negative for abdominal pain, diarrhea, nausea and vomiting.  Musculoskeletal: Negative for arthralgias, back pain and myalgias.  Skin: Negative for rash.  Neurological: Positive for headaches. Negative for dizziness and light-headedness.    Allergies  Zoloft [sertraline hcl]  Home  Medications   Prior to Admission medications   Medication Sig Start Date End Date Taking? Authorizing Provider  albuterol (PROVENTIL HFA;VENTOLIN HFA) 108 (90 Base) MCG/ACT inhaler Inhale 1-2 puffs into the lungs every 6 (six) hours as needed for wheezing or shortness of breath. 01/20/16   Junius Finner, PA-C  benzonatate (TESSALON) 100 MG capsule Take 1-2 capsules (100-200 mg total) by mouth every 8 (eight) hours. 04/17/17   Junius Finner, PA-C  buPROPion (WELLBUTRIN XL) 300 MG 24 hr tablet Take 1 tablet (300 mg total) by mouth daily. 11/21/16   Sunnie Nielsen, DO  Cetirizine HCl 10 MG CAPS Take 1 capsule (10 mg total) by mouth daily. 12/20/12   Floydene Flock, MD  fluticasone (FLONASE) 50 MCG/ACT nasal spray Place 2 sprays into both nostrils daily. 03/17/14   Hommel, Sean, DO  fluticasone furoate-vilanterol (BREO ELLIPTA) 100-25 MCG/INH AEPB One inhalation daily. 05/25/16   Laren Boom, DO  ibuprofen (ADVIL,MOTRIN) 600 MG tablet Take 1 tablet (600 mg total) by mouth every 6 (six) hours as needed. 11/09/16   Junius Finner, PA-C  meloxicam (MOBIC) 15 MG tablet Take 15 mg by mouth daily.    [provider]  montelukast (SINGULAIR) 10 MG tablet Take 1 tablet (10 mg total) by mouth at bedtime. 05/25/16   Laren Boom, DO  omeprazole (PRILOSEC) 10 MG capsule Take 10 mg by mouth daily.    [provider]  predniSONE (DELTASONE) 20 MG tablet 3 tabs po day  one, then 2 po daily x 4 days 04/17/17   Junius Finner'Malley, Roddrick Sharron, PA-C   Meds Ordered and Administered this Visit  Medications - No data to display  BP (!) 146/87 (BP Location: Left Arm)   Pulse (!) 106   Temp 98.9 F (37.2 C) (Oral)   Ht 5\' 7"  (1.702 m)   Wt 177 lb (80.3 kg)   SpO2 95%   BMI 27.72 kg/m  No data found.   Physical Exam  Constitutional: He is oriented to person, place, and time. He appears well-developed and well-nourished. No distress.  HENT:  Head: Normocephalic and atraumatic.  Right Ear: Tympanic membrane  normal.  Left Ear: Tympanic membrane normal.  Nose: Mucosal edema present.  Mouth/Throat: Uvula is midline, oropharynx is clear and moist and mucous membranes are normal.  Eyes: EOM are normal.  Neck: Normal range of motion. Neck supple.  Cardiovascular: Normal rate and regular rhythm.   Pulmonary/Chest: Effort normal. No stridor. No respiratory distress. He has wheezes (faint diffuse wheeze). He has no rales.  Musculoskeletal: Normal range of motion.  Lymphadenopathy:    He has no cervical adenopathy.  Neurological: He is alert and oriented to person, place, and time.  Skin: Skin is warm and dry. He is not diaphoretic.  Psychiatric: He has a normal mood and affect. His behavior is normal.  Nursing note and vitals reviewed.   Urgent Care Course     Procedures (including critical care time)  Labs Review Labs Reviewed - No data to display  Imaging Review No results found.   MDM   1. Viral URI with cough    Symptoms likely viral in nature given short duration of symptoms.  Encouraged symptomatic treatment.   Rx: Prednisone and Coy Saunastessalon     O'Malley, Ellie Spickler, PA-C 04/17/17 1656

## 2017-04-17 NOTE — ED Triage Notes (Signed)
Sinus congestion, dry cough, sinus headache x 4 days

## 2017-04-17 NOTE — Discharge Instructions (Signed)

## 2017-04-18 ENCOUNTER — Encounter: Payer: Self-pay | Admitting: Osteopathic Medicine

## 2017-05-04 ENCOUNTER — Encounter: Payer: Self-pay | Admitting: Pulmonary Disease

## 2017-05-04 ENCOUNTER — Other Ambulatory Visit (INDEPENDENT_AMBULATORY_CARE_PROVIDER_SITE_OTHER): Payer: Managed Care, Other (non HMO)

## 2017-05-04 ENCOUNTER — Ambulatory Visit (INDEPENDENT_AMBULATORY_CARE_PROVIDER_SITE_OTHER): Payer: Managed Care, Other (non HMO) | Admitting: Pulmonary Disease

## 2017-05-04 DIAGNOSIS — J301 Allergic rhinitis due to pollen: Secondary | ICD-10-CM

## 2017-05-04 DIAGNOSIS — J4540 Moderate persistent asthma, uncomplicated: Secondary | ICD-10-CM

## 2017-05-04 NOTE — Patient Instructions (Signed)
Continue on breo daily-this is a maintenance medication Use albuterol as needed for rescue-2 puffs every 6 hours as needed  Allergy testing- RAST panel Avoid NSAIDs due to her history of nasal polyps -use Tylenol instead if needed  Call us for worsening symptoms

## 2017-05-04 NOTE — Assessment & Plan Note (Signed)
Triggers may include seasonal allergens. Reason for her recent worsening over the last 2 years could include sinusitis, reflux  Continue on breo daily-this is a maintenance medication Use albuterol as needed for rescue-2 puffs every 6 hours as needed  Allergy testing- RAST panel Avoid NSAIDs due to her history of nasal polyps -use Tylenol instead if needed  Call us for worsening symptoms

## 2017-05-04 NOTE — Assessment & Plan Note (Signed)
Continue Zyrtec and Flonase RAST panel If symptoms persist, investigate with CT sinuses

## 2017-05-04 NOTE — Progress Notes (Signed)
Subjective:    Patient ID: Grant Peterson, male    DOB: [birth year], 46 y.o.   MRN: [id number]  HPI  Chief Complaint  Patient presents with  . 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 He has several concerns today -Intermittent wheezing and dyspnea on exertion ongoing for 16 years -1 episode of hemoptysis about 2 mL of frank blood from one month ago -Abnormal imaging studies  He reports exposure to [employer] dust on 9/11, he was first responder with the hazmat team. As part of the follow-up program he had 2 spirometry done -03/2008 -ratio was 81, with FEV1 of 87% and FVC of 88%  -11/2014 ratio of 80, FEV1 of 85% and FVC of 84%  He does report increased wheezing over the past few years, initially only needed albuterol but over the last 3 years has been on BREO daily. He required 5 urgent care visits in 2017 mostly during the spring and summer. He was seen by an ENT physician and was told that he had nasal polyps and inspection with no imaging studies were performed he had a recent urgent care visit in 04/2017 and was given prednisone and Tessalon Perles which improved his exacerbation. Chest x-ray did not show any infiltrates or effusions CT chest performed due to hemoptysis did not show any infiltrates or effusions but to my review shows mild bronchiectasis  He reports an occasional postnasal drip. Reports seasonal allergies for which he takes Zyrtec and Flonase. He reports a childhood history of asthma but he grew out of. He reports frequent GERD symptoms for which he takes daily omeprazole  He lives with his fiance who is a smoker. He smokes an occasional cigar every month. He has 2 cats at home. [place] was [place] at [place] and [place] late 90[place]  Spirometry today shows normal lung function with ratio 86, FEV1 91% FVC of 84%    Past Medical History:  Diagnosis  Date  . Adult respiratory distress syndrome (HCC)   . Anxiety   . Frequent sinus infections   . Reactive airway disease     Past Surgical History:  Procedure Laterality Date  . BUNIONECTOMY  04/2008   right    Allergies  Allergen Reactions  . Zoloft [Sertraline Hcl] Other (See Comments)    Flat affect     Social History   Social History  . Marital status: Single    Spouse name: N/A  . Number of children: N/A  . Years of education: N/A   Occupational History  . Not on file.   Social History Main Topics  . Smoking status: Current Some Day Smoker  . Smokeless tobacco: Never Used     Comment: Patient only sucks on cigarette, does not inhale. 1 cigarette per month.   . Alcohol use Yes  . Drug use: No  . Sexual activity: Yes    Partners: Female   Other Topics Concern  . Not on file   Social History Narrative  . No narrative on file       Family History  Problem Relation Age of Onset  . Asthma Mother   . COPD Mother   . Hypertension Father   . Hyperlipidemia Father   . Parkinsonism Father   . Alcoholism Unknown        grandfather  . Lung cancer Unknown  grandfather    Review of Systems Constitutional: negative for anorexia, fevers and sweats  Eyes: negative for irritation, redness and visual disturbance  Ears, nose, mouth, throat, and face: negative for earaches, epistaxis, nasal congestion and sore throat  Respiratory: negative for cough,  sputum  Cardiovascular: negative for chest pain, dyspnea, lower extremity edema, orthopnea, palpitations and syncope  Gastrointestinal: negative for abdominal pain, constipation, diarrhea, melena, nausea and vomiting  Genitourinary:negative for dysuria, frequency and hematuria  Hematologic/lymphatic: negative for bleeding, easy bruising and lymphadenopathy  Musculoskeletal:negative for arthralgias, muscle weakness and stiff joints  Neurological: negative for coordination problems, gait problems, headaches and  weakness  Endocrine: negative for diabetic symptoms including polydipsia, polyuria and weight loss     Objective:   Physical Exam  Gen. Pleasant, well-nourished, in no distress, normal affect ENT - no lesions, no post nasal drip Neck: No JVD, no thyromegaly, no carotid bruits Lungs: no use of accessory muscles, no dullness to percussion, clear without rales or rhonchi  Cardiovascular: Rhythm regular, heart sounds  normal, no murmurs or gallops, no peripheral edema Abdomen: soft and non-tender, no hepatosplenomegaly, BS normal. Musculoskeletal: No deformities, no cyanosis or clubbing Neuro:  alert, non focal       Assessment & Plan:

## 2017-05-05 LAB — RESPIRATORY ALLERGY PROFILE REGION II ~~LOC~~
ALLERGEN, D PTERNOYSSINUS, D1: 1.72 kU/L — AB
Allergen, Comm Silver Birch, t9: <0.1 kU/L
Allergen, Cottonwood, t14: <0.1 kU/L
Allergen, Oak,t7: <0.1 kU/L
Box Elder IgE: <0.1 kU/L
Cat Dander: <0.1 kU/L
Cockroach: <0.1 kU/L
D. FARINAE: 2.79 kU/L — AB
Dog Dander: <0.1 kU/L
Elm IgE: <0.1 kU/L
IgE (Immunoglobulin E), Serum: 9 kU/L (ref <115)
Sheep Sorrel IgE: <0.1 kU/L
TIMOTHY GRASS: 1.06 kU/L — AB

## 2018-02-09 ENCOUNTER — Telehealth: Payer: Self-pay

## 2018-02-09 NOTE — Telephone Encounter (Signed)
Looking at his medication list, it looks like several of the medicines have not been prescribed since 2017 or were written by Dr Ivan AnchorsHommel, he needs to tell us what exactly he needs refilled as we cannot confirm what is "active" - I am okay to send 30 days prescription for whatever he needs now. But since he hasn't been seen here since April 2018, and that was an acute visit, he needs to schedule a routine checkup for his chronic medical issues (not annual wellness checkup)    No need for rudeness, if patient is acting inappropriately, please Education officer, communityalert practice manager.

## 2018-02-09 NOTE — Telephone Encounter (Signed)
Pt left a demanding vm msg that he is in need of refills for all [facility]  Attempted to contact pt for clarification, no vm msg box set up. Unable to inform pt that an appt is required since its been nearly a yr since last office visit. Will attempt to contact pt later on today for appt scheduling.

## 2018-02-09 NOTE — Telephone Encounter (Signed)
Second attempt to contact patient - no answer, unable to leave a vm msg. Voicemail not set up.

## 2018-07-26 IMAGING — CT CT CHEST W/ CM
2 of 3 series · 15 of 36 positions shown, 18 images · IV contrast (iopamidol)
Comparison: Chest x-ray March 30, 2017

CLINICAL DATA: Tightness in chest and throat. Coughed up a few cc's
of bright red blood without mucus 1 week ago. History of in relation
injury in 4556.

EXAM:
CT CHEST WITH CONTRAST
TECHNIQUE: Multidetector CT imaging of the chest was performed during
intravenous contrast administration.
CONTRAST:  100mL [id number] IOPAMIDOL ([id number]) INJECTION 61%

[Series 2: axial st · axial · 0.82mm/px · z∈[-357,-67]mm · 12 of 68 slices shown, 15 images]
[im 5/68  mediastinal]
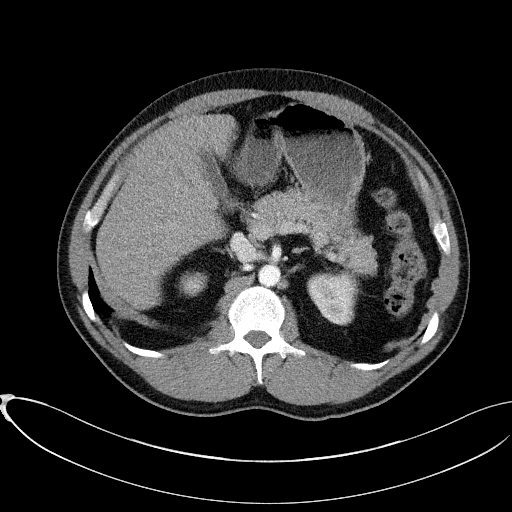
[im 5/68  lung]
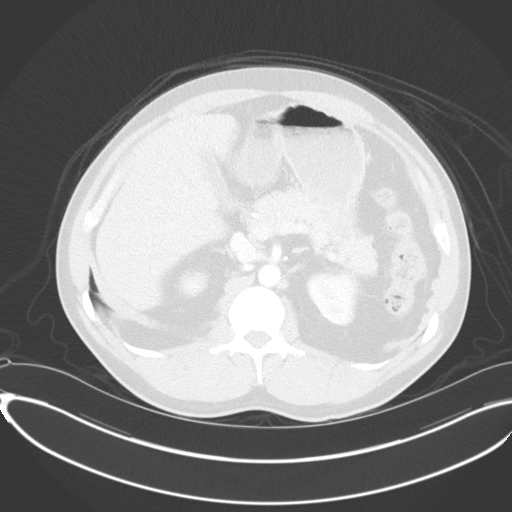
[im 10/68  lung]
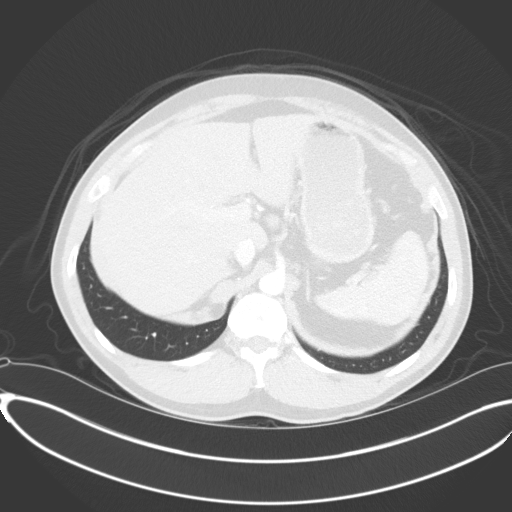
[im 15/68  lung]
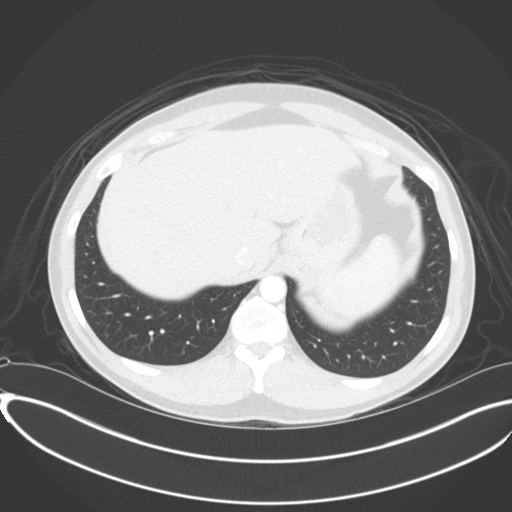
[im 20/68  lung]
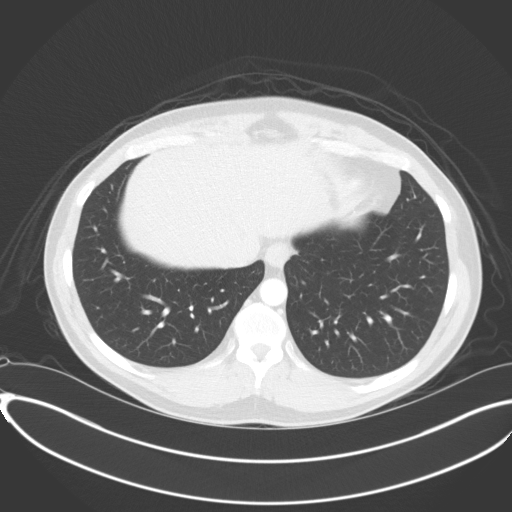
[im 25/68  mediastinal]
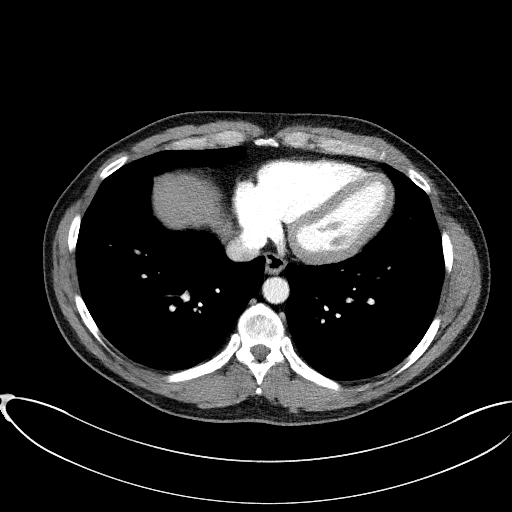
[im 25/68  lung]
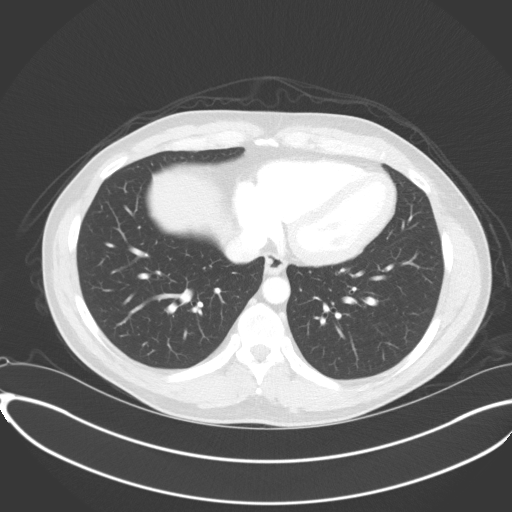
[im 30/68  lung]
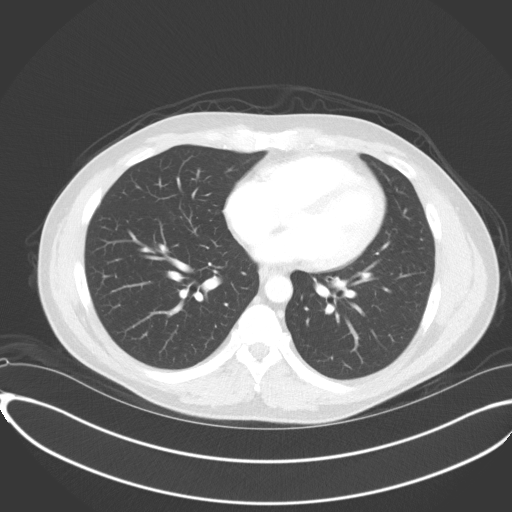
[im 38/68  lung]
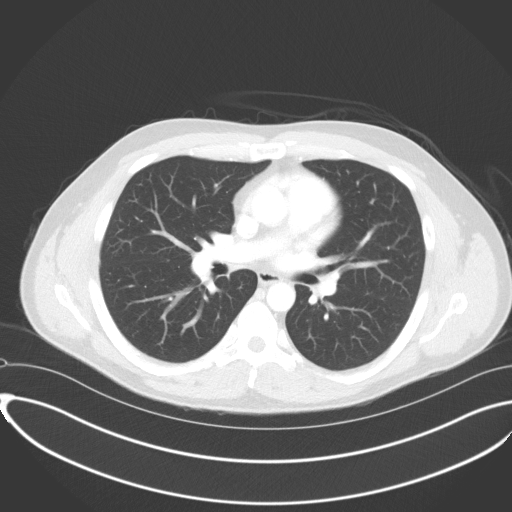
[im 43/68  lung]
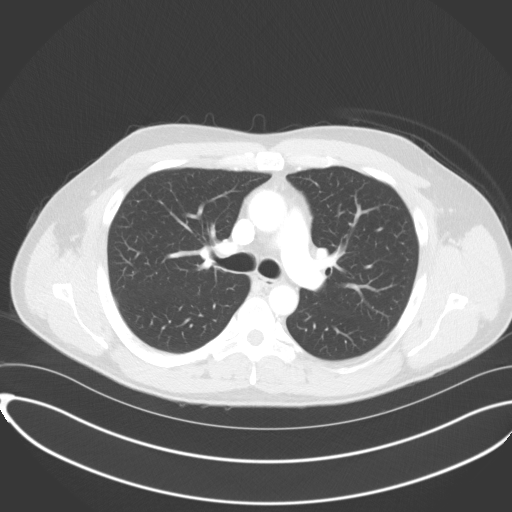
[im 48/68  mediastinal]
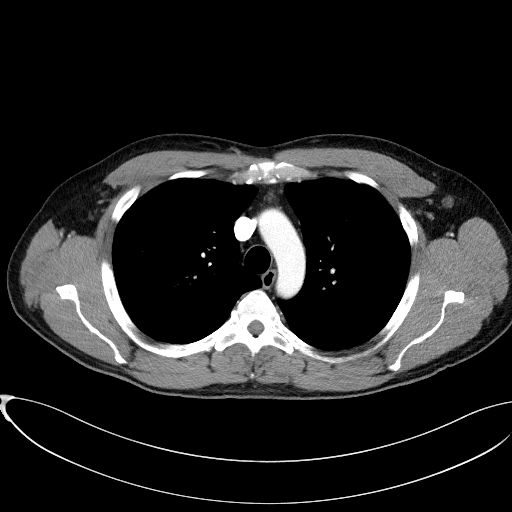
[im 48/68  lung]
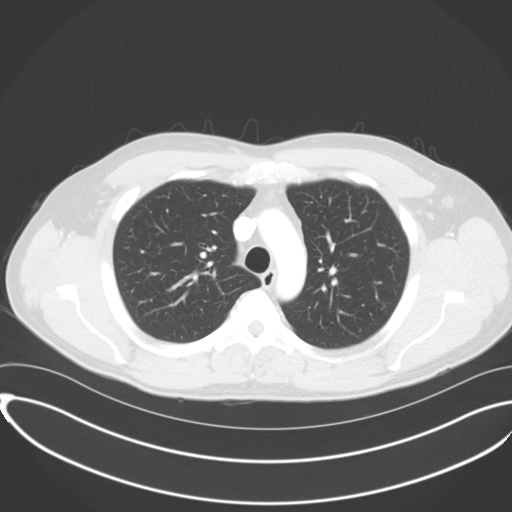
[im 53/68  lung]
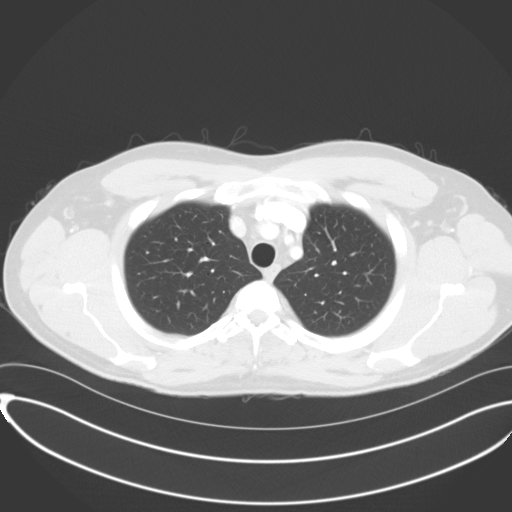
[im 58/68  lung]
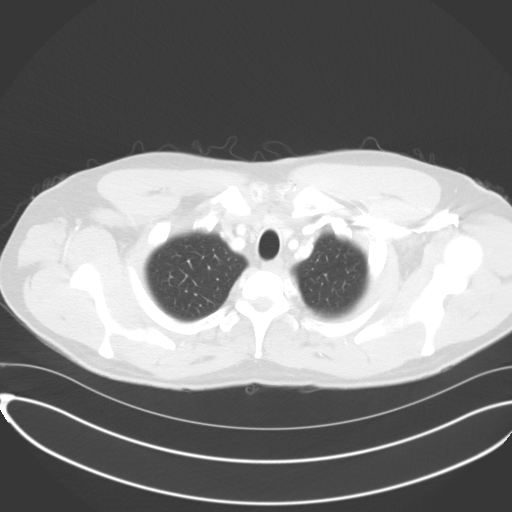
[im 63/68  lung]
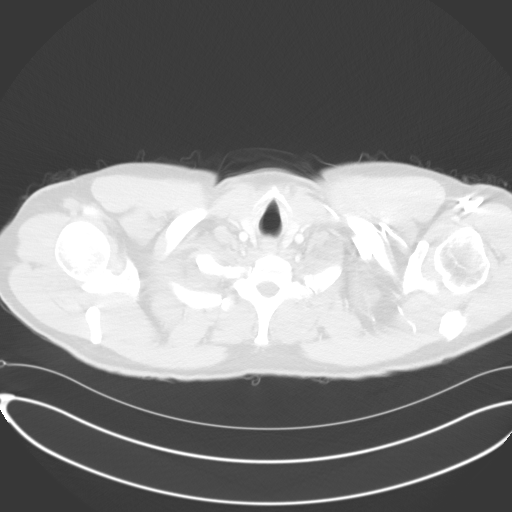

[Series 5: coronal · coronal · 0.67mm/px · 3 of 136 slices shown]
[im 28/136  lung]
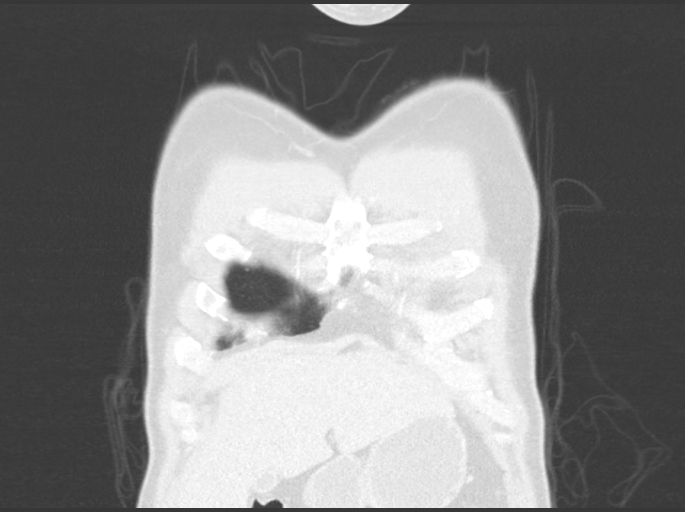
[im 55/136  lung]
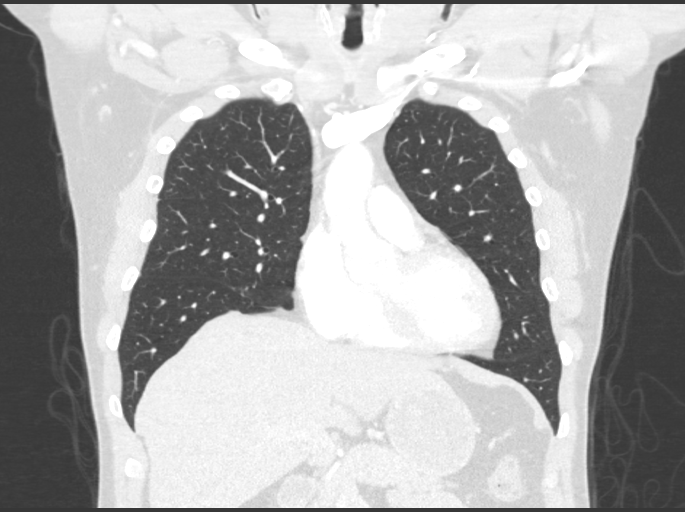
[im 82/136  lung]
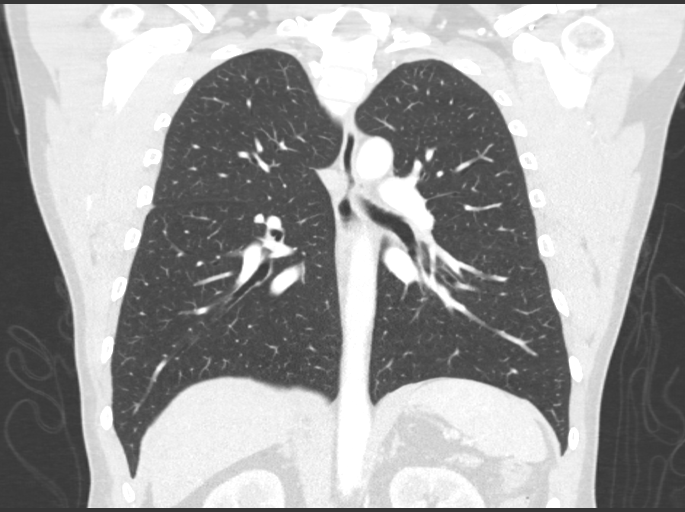

[15 of 36 positions shown; findings below may reference images not displayed]

FINDINGS: Cardiovascular: No significant vascular findings. Normal heart size.
No pericardial effusion.

Mediastinum/Nodes: No enlarged mediastinal, hilar, or axillary lymph
nodes. Thyroid gland, trachea, and esophagus demonstrate no
significant findings.

Lungs/Pleura: Lungs are clear. No pleural effusion or pneumothorax.

Upper Abdomen: No acute abnormality.

Musculoskeletal: No chest wall abnormality. No acute or significant
osseous findings.
IMPRESSION: No cause for the patient's symptoms identified. No suspicious
abnormalities.

## 2018-08-10 ENCOUNTER — Other Ambulatory Visit: Payer: Self-pay

## 2018-08-10 ENCOUNTER — Encounter: Payer: Self-pay | Admitting: Emergency Medicine

## 2018-08-10 ENCOUNTER — Emergency Department (INDEPENDENT_AMBULATORY_CARE_PROVIDER_SITE_OTHER)
Admission: EM | Admit: 2018-08-10 | Discharge: 2018-08-10 | Disposition: A | Discharge status: Home / Self Care | Payer: Managed Care, Other (non HMO) | Source: Home / Self Care | Attending: Family Medicine | Admitting: Family Medicine

## 2018-08-10 DIAGNOSIS — R0981 Nasal congestion: Secondary | ICD-10-CM

## 2018-08-10 DIAGNOSIS — B9789 Other viral agents as the cause of diseases classified elsewhere: Secondary | ICD-10-CM

## 2018-08-10 DIAGNOSIS — J069 Acute upper respiratory infection, unspecified: Secondary | ICD-10-CM | POA: Diagnosis not present

## 2018-08-10 MED ORDER — IPRATROPIUM BROMIDE 0.06 % NA SOLN: 2.0000 | Freq: Four times a day (QID) | NASAL | 1 refills | Status: DC

## 2018-08-10 NOTE — Discharge Instructions (Signed)
°  You may take 500mg acetaminophen every 4-6 hours or in combination with ibuprofen 400-600mg every 6-8 hours as needed for pain, inflammation, and fever. ° °Be sure to drink at least eight 8oz glasses of water to stay well hydrated and get at least 8 hours of sleep at night, preferably more while sick.  ° °Please follow up with family medicine in 1 week if not improving.  °

## 2018-08-10 NOTE — ED Triage Notes (Signed)
Patient reports sense of fever and chills over past 36 hours; congestion obvious.

## 2018-08-10 NOTE — ED Provider Notes (Signed)
[name]    CSN: [id number] Arrival date & time: 08/10/18  1503     History   Chief Complaint Chief Complaint  Patient presents with  . Chills  . Nasal Congestion    HPI Grant Peterson is a 47 y.o. male.   HPI  Grant Peterson is a 47 y.o. male presenting to UC with c/o 36 hours of feeling feverish with chills, nasal congestion, mild sore throat from post-nasal drip, and mild cough. He has tried Flonase and Afrin with mild relief. 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 Denies n/v/d. No chest pain or SOB. He is scheduled to work a 24 hour shift starting this evening and does not feel like he is well enough to work.    Past Medical History:  Diagnosis Date  . Adult respiratory distress syndrome (HCC)   . Anxiety   . Frequent sinus infections   . Reactive airway disease     Patient Active Problem List   Diagnosis Date Noted  . Cervical radiculopathy 12/07/2016  . Hyperlipidemia 02/05/2015  . Hypertriglyceridemia 11/20/2014  . Depression 03/17/2014  . Asthma, chronic 03/17/2014  . Seasonal allergies 03/17/2014  . Inhalation injury 03/17/2014  . Constipation 03/17/2014    Past Surgical History:  Procedure Laterality Date  . BUNIONECTOMY  04/2008   right       Home Medications    Prior to Admission medications   Medication Sig Start Date End Date Taking? Authorizing Provider  albuterol (PROVENTIL HFA;VENTOLIN HFA) 108 (90 Base) MCG/ACT inhaler Inhale 1-2 puffs into the lungs every 6 (six) hours as needed for wheezing or shortness of breath. 01/20/16   Lurene Shadow, PA-C  buPROPion (WELLBUTRIN XL) 300 MG 24 hr tablet Take 1 tablet (300 mg total) by mouth daily. 11/21/16   Sunnie Nielsen, DO  Cetirizine HCl 10 MG CAPS Take 1 capsule (10 mg total) by mouth daily. 12/20/12   Floydene Flock, MD  fluticasone (FLONASE) 50 MCG/ACT nasal spray Place 2 sprays into both nostrils daily. 03/17/14    Hommel, Sean, DO  fluticasone furoate-vilanterol (BREO ELLIPTA) 100-25 MCG/INH AEPB One inhalation daily. 05/25/16   Laren Boom, DO  ibuprofen (ADVIL,MOTRIN) 600 MG tablet Take 1 tablet (600 mg total) by mouth every 6 (six) hours as needed. 11/09/16   Lurene Shadow, PA-C  ipratropium (ATROVENT) 0.06 % nasal spray Place 2 sprays into both nostrils 4 (four) times daily. 08/10/18   Lurene Shadow, PA-C  meloxicam (MOBIC) 15 MG tablet Take 15 mg by mouth daily.    [provider]  montelukast (SINGULAIR) 10 MG tablet Take 1 tablet (10 mg total) by mouth at bedtime. 05/25/16   Laren Boom, DO  omeprazole (PRILOSEC) 10 MG capsule Take 10 mg by mouth daily.    [provider]    Family History Family History  Problem Relation Age of Onset  . Asthma Mother   . COPD Mother   . Hypertension Father   . Hyperlipidemia Father   . Parkinsonism Father   . Alcoholism Unknown        grandfather  . Lung cancer Unknown        grandfather    Social History Social History   Tobacco Use  . Smoking status: Current Some Day Smoker  . Smokeless tobacco: Never Used  . Tobacco comment: Patient only sucks on cigarette, does not inhale. 1 cigarette per month.   Substance Use Topics  .  Alcohol use: Yes  . Drug use: No     Allergies   Zoloft [sertraline hcl]   Review of Systems Review of Systems  Constitutional: Negative for chills and fever.  HENT: Positive for congestion, postnasal drip, rhinorrhea and sore throat. Negative for ear pain, trouble swallowing and voice change.   Respiratory: Positive for cough. Negative for shortness of breath.   Cardiovascular: Negative for chest pain and palpitations.  Gastrointestinal: Negative for abdominal pain, diarrhea, nausea and vomiting.  Musculoskeletal: Negative for arthralgias, back pain and myalgias.  Skin: Negative for rash.  Neurological: Positive for headaches. Negative for dizziness and light-headedness.     Physical  Exam Triage Vital Signs ED Triage Vitals  Enc Vitals Group     BP 08/10/18 1534 128/82     Pulse Rate 08/10/18 1534 82     Resp 08/10/18 1534 18     Temp 08/10/18 1534 98.4 F (36.9 C)     Temp Source 08/10/18 1534 Oral     SpO2 08/10/18 1534 96 %     Weight 08/10/18 1535 185 lb (83.9 kg)     Height 08/10/18 1535 5\' 6"  (1.676 m)     Head Circumference --      Peak Flow --      Pain Score 08/10/18 1535 0     Pain Loc --      Pain Edu? --      Excl. in GC? --    No data found.  Updated Vital Signs BP 128/82 (BP Location: Right Arm)   Pulse 82   Temp 98.4 F (36.9 C) (Oral)   Resp 18   Ht 5\' 6"  (1.676 m)   Wt 185 lb (83.9 kg)   SpO2 96%   BMI 29.86 kg/m   Visual Acuity Right Eye Distance:   Left Eye Distance:   Bilateral Distance:    Right Eye Near:   Left Eye Near:    Bilateral Near:     Physical Exam  Constitutional: He is oriented to person, place, and time. He appears well-developed and well-nourished. No distress.  HENT:  Head: Normocephalic and atraumatic.  Right Ear: Tympanic membrane normal.  Left Ear: Tympanic membrane normal.  Nose: Mucosal edema present. Right sinus exhibits no maxillary sinus tenderness and no frontal sinus tenderness. Left sinus exhibits no maxillary sinus tenderness and no frontal sinus tenderness.  Mouth/Throat: Uvula is midline, oropharynx is clear and moist and mucous membranes are normal.  Eyes: EOM are normal.  Neck: Normal range of motion. Neck supple.  Cardiovascular: Normal rate and regular rhythm.  Pulmonary/Chest: Effort normal and breath sounds normal. No stridor. No respiratory distress. He has no wheezes. He has no rales.  Musculoskeletal: Normal range of motion.  Lymphadenopathy:    He has no cervical adenopathy.  Neurological: He is alert and oriented to person, place, and time.  Skin: Skin is warm and dry. He is not diaphoretic.  Psychiatric: He has a normal mood and affect. His behavior is normal.  Nursing note  and vitals reviewed.    UC Treatments / Results  Labs (all labs ordered are listed, but only abnormal results are displayed) Labs Reviewed - No data to display  EKG None  Radiology No results found.  Procedures Procedures (including critical care time)  Medications Ordered in UC Medications - No data to display  Initial Impression / Assessment and Plan / UC Course  I have reviewed the triage vital signs and the nursing notes.  Pertinent labs &  imaging results that were available during my care of the patient were reviewed by me and considered in my medical decision making (see chart for details).     Hx and exam c/w viral illness Encouraged symptomatic treatment Home care instructions provided Work note provided F/u with PCP as needed.   Final Clinical Impressions(s) / UC Diagnoses   Final diagnoses:  Viral URI with cough  Nasal congestion     Discharge Instructions      You may take 500mg  acetaminophen every 4-6 hours or in combination with ibuprofen 400-600mg  every 6-8 hours as needed for pain, inflammation, and fever.  Be sure to drink at least eight 8oz glasses of water to stay well hydrated and get at least 8 hours of sleep at night, preferably more while sick.   Please follow up with family medicine in 1 week if not improving.     ED Prescriptions    Medication Sig Dispense Auth. Provider   ipratropium (ATROVENT) 0.06 % nasal spray  (Status: Discontinued) Place 2 sprays into both nostrils 4 (four) times daily. 15 mL Doroteo Glassman, Arriah Wadle O, PA-C   ipratropium (ATROVENT) 0.06 % nasal spray Place 2 sprays into both nostrils 4 (four) times daily. 15 mL Lurene Shadow, PA-C     Controlled Substance Prescriptions [place] Controlled Substance Registry consulted? Not Applicable   Rolla Plate 08/10/18 1610

## 2018-08-22 ENCOUNTER — Encounter: Payer: Self-pay | Admitting: Osteopathic Medicine

## 2018-08-22 ENCOUNTER — Ambulatory Visit (INDEPENDENT_AMBULATORY_CARE_PROVIDER_SITE_OTHER): Payer: Managed Care, Other (non HMO) | Admitting: Osteopathic Medicine

## 2018-08-22 VITALS — BP 137/91 | HR 85 | Temp 98.6°F | Wt 185.9 lb

## 2018-08-22 DIAGNOSIS — Z23 Encounter for immunization: Secondary | ICD-10-CM | POA: Diagnosis not present

## 2018-08-22 DIAGNOSIS — F329 Major depressive disorder, single episode, unspecified: Secondary | ICD-10-CM | POA: Diagnosis not present

## 2018-08-22 DIAGNOSIS — J4530 Mild persistent asthma, uncomplicated: Secondary | ICD-10-CM

## 2018-08-22 DIAGNOSIS — Z0000 Encounter for general adult medical examination without abnormal findings: Secondary | ICD-10-CM | POA: Diagnosis not present

## 2018-08-22 DIAGNOSIS — Z113 Encounter for screening for infections with a predominantly sexual mode of transmission: Secondary | ICD-10-CM

## 2018-08-22 DIAGNOSIS — R140 Abdominal distension (gaseous): Secondary | ICD-10-CM | POA: Insufficient documentation

## 2018-08-22 DIAGNOSIS — F32A Depression, unspecified: Secondary | ICD-10-CM

## 2018-08-22 MED ORDER — ALBUTEROL SULFATE HFA 108 (90 BASE) MCG/ACT IN AERS: 1.0000 | INHALATION_SPRAY | Freq: Four times a day (QID) | RESPIRATORY_TRACT | 11 refills | Status: DC | PRN

## 2018-08-22 MED ORDER — DICYCLOMINE HCL 20 MG PO TABS: 20.0000 mg | ORAL_TABLET | Freq: Three times a day (TID) | ORAL | 1 refills | Status: DC

## 2018-08-22 MED ORDER — MONTELUKAST SODIUM 10 MG PO TABS: 10.0000 mg | ORAL_TABLET | Freq: Every day | ORAL | 3 refills | Status: DC

## 2018-08-22 MED ORDER — BUPROPION HCL ER (XL) 300 MG PO TB24: 300.0000 mg | ORAL_TABLET | Freq: Every day | ORAL | 3 refills | Status: DC

## 2018-08-22 MED ORDER — FLUTICASONE FUROATE-VILANTEROL 100-25 MCG/INH IN AEPB: INHALATION_SPRAY | RESPIRATORY_TRACT | 11 refills | Status: DC

## 2018-08-22 NOTE — Patient Instructions (Addendum)
General Preventive Care  Most recent routine screening lipids/other labs:   Tobacco: don't! Alcohol: responsible moderation is ok for most people. Recreational/Illicit Drugs: don't!  Exercise: as tolerated to reduce risk of cardiovascular disease and diabetes  Mental health: if need for mental health care (medicines, counseling, other), or concerns about moods, please let me know!   Sexual health: if need for STD testing, or if concerns with libido/pain, please let me know!  Vaccines  Flu vaccine: recommended every fall (by Halloween!)  Shingles vaccine: Shingrix recommended after age 45  Pneumonia vaccines: Prevnar and Pneumovax recommended after age 45  Tetanus booster: Tdap recommended every 10 years Cancer screenings   Colon cancer screening: recommended at age 45, colonoscopy sooner if risk factors   Prostate cancer screening: recommendations vary, optional PSA blood test for men around age 45 Infection screenings . HIV: recommended screening at least once age 45-65, more often if risk factors  . Gonorrhea/Chlamydia: screening as needed . Hepatitis C: recommended for anyone born [id number] Other . Bone Density Test: recommended for men at age 45, sooner depending on risk factors . Advanced Directive: Living Will and/or Healthcare Power of Attorney recommended for everyone, regardless of age or health . Cholesterol: recommended screening annually . Diabetes: recommended screening annually  . Thyroid and Vitamin D: routine screening not recommended, most insurance will not cover this test             2017 UpToDate Characteristics and sources of common FODMAPs  Word that corresponds to letter in acronym Compounds in this category Foods that contain these compounds  F Fermentable  O Oligosaccharides Fructans, galacto-oligosaccharides Wheat, barley, rye, onion, leek, white part of spring onion, garlic, shallots, artichokes, beetroot, fennel, peas, chicory,  pistachio, cashews, legumes, lentils, and chickpeas  D Disaccharides Lactose Milk, custard, ice cream, and yogurt  M Monosaccharides "Free fructose" (fructose in excess of glucose) Apples, pears, mangoes, cherries, watermelon, asparagus, sugar snap peas, honey, high-fructose corn syrup  A And  P Polyols Sorbitol, mannitol, maltitol, and xylitol Apples, pears, apricots, cherries, nectarines, peaches, plums, watermelon, mushrooms, cauliflower, artificially sweetened chewing gum and confectionery  FODMAPs: fermentable oligosaccharides, disaccharides, monosaccharides, and polyols. Adapted by permission from [employer]: [employer] Journal of Gastroenterology. Lonell FaceShepherd SJ, Lomer MC, [place]. Short-chain carbohydrates and functional gastrointestinal disorders. Am J Gastroenterol 2013; 108:707. Copyright  2013. www.nature.com/ajg. Graphic 1610990186 Version 2.0

## 2018-08-22 NOTE — Progress Notes (Signed)
HPI: Grant Peterson is a 47 y.o. male who  has a past medical history of Adult respiratory distress syndrome (HCC), Anxiety, Frequent sinus infections, and Reactive airway disease.  he presents to [facility] today, 08/22/18,  for chief complaint of: Annual physical     Patient here for annual physical / wellness exam.  See preventive care reviewed as below.  Recent labs reviewed in detail with the patient.   Additional concerns today include:   Asthma has been a bit worse for him lately, he has not been totally compliant with his inhalers  Would like to get back on Wellbutrin, has been off of this for some time and definitely noticed a difference in his mood, he felt better when taking the medicine.  Reports some occasional abdominal bloating, typically after a meal, not with every meal though, having only one bowel movement a day when typically he would have about 2-3.  No change in the character of stool, no pain     Past medical, surgical, social and family history reviewed:  Patient Active Problem List   Diagnosis Date Noted  . Cervical radiculopathy 12/07/2016  . Hyperlipidemia 02/05/2015  . Hypertriglyceridemia 11/20/2014  . Depression 03/17/2014  . Asthma, chronic 03/17/2014  . Seasonal allergies 03/17/2014  . Inhalation injury 03/17/2014  . Constipation 03/17/2014    Past Surgical History:  Procedure Laterality Date  . BUNIONECTOMY  04/2008   right    Social History   Tobacco Use  . Smoking status: Current Some Day Smoker  . Smokeless tobacco: Never Used  . Tobacco comment: Pt smokes 2 cigars/wk  Substance Use Topics  . Alcohol use: Yes    Family History  Problem Relation Age of Onset  . Asthma Mother   . COPD Mother   . Hypertension Father   . Hyperlipidemia Father   . Parkinsonism Father   . Alcoholism Unknown        grandfather  . Lung cancer Unknown        grandfather     Current medication list and  allergy/intolerance information reviewed:    Current Outpatient Medications  Medication Sig Dispense Refill  . albuterol (PROVENTIL HFA;VENTOLIN HFA) 108 (90 Base) MCG/ACT inhaler Inhale 1-2 puffs into the lungs every 6 (six) hours as needed for wheezing or shortness of breath. 1 Inhaler 0  . buPROPion (WELLBUTRIN XL) 300 MG 24 hr tablet Take 1 tablet (300 mg total) by mouth daily. 30 tablet 2  . Cetirizine HCl 10 MG CAPS Take 1 capsule (10 mg total) by mouth daily. 30 capsule 3  . fluticasone (FLONASE) 50 MCG/ACT nasal spray Place 2 sprays into both nostrils daily. 16 g 12  . fluticasone furoate-vilanterol (BREO ELLIPTA) 100-25 MCG/INH AEPB One inhalation daily. 60 each 5  . ibuprofen (ADVIL,MOTRIN) 600 MG tablet Take 1 tablet (600 mg total) by mouth every 6 (six) hours as needed. 30 tablet 0  . ipratropium (ATROVENT) 0.06 % nasal spray Place 2 sprays into both nostrils 4 (four) times daily. 15 mL 1  . montelukast (SINGULAIR) 10 MG tablet Take 1 tablet (10 mg total) by mouth at bedtime. 30 tablet 3  . omeprazole (PRILOSEC) 10 MG capsule Take 10 mg by mouth daily.    . meloxicam (MOBIC) 15 MG tablet Take 15 mg by mouth daily.     No current facility-administered medications for this visit.     Allergies  Allergen Reactions  . Zoloft [Sertraline Hcl] Other (See Comments)  Flat affect      Review of Systems:  Constitutional:  No  fever, no chills, No recent illness, No unintentional weight changes. No significant fatigue.   HEENT: No  headache, no vision change, no hearing change, No sore throat, No  sinus pressure  Cardiac: No  chest pain, No  pressure, No palpitations, No  Orthopnea  Respiratory:  No  shortness of breath. +Cough  Gastrointestinal: No  abdominal pain, No  nausea, No  vomiting,  No  blood in stool, No  diarrhea, No  constipation   Musculoskeletal: No new myalgia/arthralgia  Skin: No  Rash, No other wounds/concerning lesions  Genitourinary: No  incontinence,  No  abnormal genital bleeding, No abnormal genital discharge  Hem/Onc: No  easy bruising/bleeding, No  abnormal lymph node  Endocrine: No cold intolerance,  No heat intolerance. No polyuria/polydipsia/polyphagia   Neurologic: No  weakness, No  dizziness, No  slurred speech/focal weakness/facial droop  Psychiatric: +concerns with depression, No  concerns with anxiety, No sleep problems, No mood problems  Exam:  BP (!) 137/91 (BP Location: Left Arm, Patient Position: Sitting, Cuff Size: Normal)   Pulse 85   Temp 98.6 F (37 C) (Oral)   Wt 185 lb 14.4 oz (84.3 kg)   BMI 30.01 kg/m   Constitutional: VS see above. General Appearance: alert, well-developed, well-nourished, NAD  Eyes: Normal lids and conjunctive, non-icteric sclera  Ears, Nose, Mouth, Throat: MMM, Normal external inspection ears/nares/mouth/lips/gums. TM normal bilaterally. Pharynx/tonsils no erythema, no exudate. Nasal mucosa normal.   Neck: No masses, trachea midline. No thyroid enlargement. No tenderness/mass appreciated. No lymphadenopathy  Respiratory: Normal respiratory effort. +bilateral wheeze, no rhonchi, no rales  Cardiovascular: S1/S2 normal, no murmur, no rub/gallop auscultated. RRR. No lower extremity edema.   Gastrointestinal: Nontender, no masses. No hepatomegaly, no splenomegaly. No hernia appreciated. Bowel sounds normal. Rectal exam deferred.   Musculoskeletal: Gait normal. No clubbing/cyanosis of digits.   Neurological: Normal balance/coordination. No tremor. No cranial nerve deficit on limited exam. Motor and sensation intact and symmetric. Cerebellar reflexes intact.   Skin: warm, dry, intact. No rash/ulcer. No concerning nevi or subq nodules on limited exam.    Psychiatric: Normal judgment/insight. Normal mood and affect. Oriented x3.      Immunization History  Administered Date(s) Administered  . Hepatitis A 09/05/1999  . Hepatitis B 09/04/1998  . Influenza,inj,Quad PF,6+ Mos 08/22/2018   . Influenza-Unspecified 01/06/2012  . Pneumococcal Polysaccharide-23 08/22/2018  . Tdap 03/06/2007, 08/22/2018     ASSESSMENT/PLAN: The primary encounter diagnosis was Annual physical exam. Diagnoses of Need for Tdap vaccination, Need for influenza vaccination, Asthma, chronic, mild persistent, uncomplicated, Depression, unspecified depression type, Routine screening for STI (sexually transmitted infection), Need for pneumococcal vaccine, and Abdominal bloating were also pertinent to this visit.  Meds ordered this encounter  Medications  . albuterol (PROVENTIL HFA;VENTOLIN HFA) 108 (90 Base) MCG/ACT inhaler    Sig: Inhale 1-2 puffs into the lungs every 6 (six) hours as needed for wheezing or shortness of breath.    Dispense:  2 Inhaler    Refill:  11  . buPROPion (WELLBUTRIN XL) 300 MG 24 hr tablet    Sig: Take 1 tablet (300 mg total) by mouth daily.    Dispense:  90 tablet    Refill:  3  . fluticasone furoate-vilanterol (BREO ELLIPTA) 100-25 MCG/INH AEPB    Sig: One inhalation daily.    Dispense:  60 each    Refill:  11    Will use savings voucher.  Marland Kitchen  montelukast (SINGULAIR) 10 MG tablet    Sig: Take 1 tablet (10 mg total) by mouth at bedtime.    Dispense:  90 tablet    Refill:  3  . dicyclomine (BENTYL) 20 MG tablet    Sig: Take 1 tablet (20 mg total) by mouth 4 (four) times daily -  before meals and at bedtime.    Dispense:  90 tablet    Refill:  1   Orders Placed This Encounter  Procedures  . Tdap vaccine greater than or equal to 7yo IM  . Flu Vaccine QUAD 6+ mos PF IM (Fluarix Quad PF)  . Pneumococcal polysaccharide vaccine 23-valent greater than or equal to 2yo subcutaneous/IM  . CBC  . COMPLETE METABOLIC PANEL WITH GFR  . Lipid panel  . TSH  . Hepatitis B surface antibody,qualitative  . Hepatitis B surface antigen  . Hepatitis B core antibody, total  . Hepatitis C antibody  . HIV Antibody (routine testing w rflx)         Patient Instructions   General  Preventive Care  Most recent routine screening lipids/other labs:   Tobacco: don't! Alcohol: responsible moderation is ok for most people. Recreational/Illicit Drugs: don't!  Exercise: as tolerated to reduce risk of cardiovascular disease and diabetes  Mental health: if need for mental health care (medicines, counseling, other), or concerns about moods, please let me know!   Sexual health: if need for STD testing, or if concerns with libido/pain, please let me know!  Vaccines  Flu vaccine: recommended every fall (by Halloween!)  Shingles vaccine: Shingrix recommended after age 45  Pneumonia vaccines: Prevnar and Pneumovax recommended after age 45  Tetanus booster: Tdap recommended every 10 years Cancer screenings   Colon cancer screening: recommended at age 45, colonoscopy sooner if risk factors   Prostate cancer screening: recommendations vary, optional PSA blood test for men around age 45 Infection screenings . HIV: recommended screening at least once age 45-65, more often if risk factors  . Gonorrhea/Chlamydia: screening as needed . Hepatitis C: recommended for anyone born [id number] Other . Bone Density Test: recommended for men at age 170, sooner depending on risk factors . Advanced Directive: Living Will and/or Healthcare Power of Attorney recommended for everyone, regardless of age or health . Cholesterol: recommended screening annually . Diabetes: recommended screening annually  . Thyroid and Vitamin D: routine screening not recommended, most insurance will not cover this test             2017 UpToDate Characteristics and sources of common FODMAPs  Word that corresponds to letter in acronym Compounds in this category Foods that contain these compounds  F Fermentable  O Oligosaccharides Fructans, galacto-oligosaccharides Wheat, barley, rye, onion, leek, white part of spring onion, garlic, shallots, artichokes, beetroot, fennel, peas, chicory, pistachio,  cashews, legumes, lentils, and chickpeas  D Disaccharides Lactose Milk, custard, ice cream, and yogurt  M Monosaccharides "Free fructose" (fructose in excess of glucose) Apples, pears, mangoes, cherries, watermelon, asparagus, sugar snap peas, honey, high-fructose corn syrup  A And  P Polyols Sorbitol, mannitol, maltitol, and xylitol Apples, pears, apricots, cherries, nectarines, peaches, plums, watermelon, mushrooms, cauliflower, artificially sweetened chewing gum and confectionery  FODMAPs: fermentable oligosaccharides, disaccharides, monosaccharides, and polyols. Adapted by permission from [employer]: [employer] Journal of Gastroenterology. Lonell FaceShepherd SJ, Lomer MC, [place]. Short-chain carbohydrates and functional gastrointestinal disorders. Am J Gastroenterol 2013; 108:707. Copyright  2013. www.nature.com/ajg. Graphic [id number] Version 2.0         Visit summary with medication  list and pertinent instructions was printed for patient to review. All questions at time of visit were answered - patient instructed to contact office with any additional concerns. ER/RTC precautions were reviewed with the patient.   Follow-up plan: Return in about 4 weeks (around 09/19/2018) for recheck breathing & bloating if needed. Otherwise 1 year for annual check-up as long as labs ok! .   Please note: voice recognition software was used to produce this document, and typos may escape review. Please contact Dr. Lyn Hollingshead for any needed clarifications.

## 2018-12-04 ENCOUNTER — Other Ambulatory Visit: Payer: Self-pay | Admitting: Osteopathic Medicine

## 2018-12-14 ENCOUNTER — Emergency Department
Admission: EM | Admit: 2018-12-14 | Discharge: 2018-12-14 | Disposition: A | Discharge status: Home / Self Care | Payer: Managed Care, Other (non HMO) | Source: Home / Self Care | Attending: Family Medicine | Admitting: Family Medicine

## 2018-12-14 ENCOUNTER — Other Ambulatory Visit: Payer: Self-pay

## 2018-12-14 DIAGNOSIS — J209 Acute bronchitis, unspecified: Secondary | ICD-10-CM | POA: Diagnosis not present

## 2018-12-14 DIAGNOSIS — R062 Wheezing: Secondary | ICD-10-CM

## 2018-12-14 DIAGNOSIS — J0101 Acute recurrent maxillary sinusitis: Secondary | ICD-10-CM

## 2018-12-14 MED ORDER — IPRATROPIUM-ALBUTEROL 0.5-2.5 (3) MG/3ML IN SOLN
3.0000 mL | Freq: Once | RESPIRATORY_TRACT | Status: AC
  Administered 2018-12-14: 3 mL via RESPIRATORY_TRACT

## 2018-12-14 MED ORDER — METHYLPREDNISOLONE ACETATE 80 MG/ML IJ SUSP
80.0000 mg | Freq: Once | INTRAMUSCULAR | Status: AC
  Administered 2018-12-14: 80 mg via INTRAMUSCULAR

## 2018-12-14 MED ORDER — ALBUTEROL SULFATE HFA 108 (90 BASE) MCG/ACT IN AERS: 1.0000 | INHALATION_SPRAY | Freq: Four times a day (QID) | RESPIRATORY_TRACT | 0 refills | Status: DC | PRN

## 2018-12-14 MED ORDER — IPRATROPIUM BROMIDE 0.06 % NA SOLN: 2.0000 | Freq: Four times a day (QID) | NASAL | 1 refills | Status: DC

## 2018-12-14 MED ORDER — AMOXICILLIN-POT CLAVULANATE 875-125 MG PO TABS: 1.0000 | ORAL_TABLET | Freq: Two times a day (BID) | ORAL | 0 refills | Status: AC

## 2018-12-14 MED ORDER — PREDNISONE 20 MG PO TABS: ORAL_TABLET | ORAL | 0 refills | Status: DC

## 2018-12-14 NOTE — ED Provider Notes (Signed)
[name]    CSN: [id number] Arrival date & time: 12/14/18  1528     History   Chief Complaint Chief Complaint  Patient presents with  . Nasal Congestion  . Wheezing  . Cough  . Shortness of Breath    HPI Grant Peterson is a 48 y.o. male.   HPI  Grant Peterson is a 48 y.o. male presenting to UC with c/o 2 months of worsening sinus congestion with pain and pressure. Pt also c/o 1 week of worsening cough, chest congestion, wheeze and SOB.  He has taken mucinex, tylenol and motrin with mild relief.  He has been using his inhaler w/o relief. Reports subjective fever.     Past Medical History:  Diagnosis Date  . Adult respiratory distress syndrome (HCC)   . Anxiety   . Frequent sinus infections   . Reactive airway disease     Patient Active Problem List   Diagnosis Date Noted  . Abdominal bloating 08/22/2018  . Cervical radiculopathy 12/07/2016  . Hyperlipidemia 02/05/2015  . Hypertriglyceridemia 11/20/2014  . Depression 03/17/2014  . Asthma, chronic 03/17/2014  . Seasonal allergies 03/17/2014  . Inhalation injury 03/17/2014  . Constipation 03/17/2014    Past Surgical History:  Procedure Laterality Date  . BUNIONECTOMY  04/2008   right       Home Medications    Prior to Admission medications   Medication Sig Start Date End Date Taking? Authorizing Provider  albuterol (PROVENTIL HFA;VENTOLIN HFA) 108 (90 Base) MCG/ACT inhaler Inhale 1-2 puffs into the lungs every 6 (six) hours as needed for wheezing or shortness of breath. 08/22/18   Sunnie Nielsen, DO  albuterol (PROVENTIL HFA;VENTOLIN HFA) 108 (90 Base) MCG/ACT inhaler Inhale 1-2 puffs into the lungs every 6 (six) hours as needed for wheezing or shortness of breath. 12/14/18   Lurene Shadow, PA-C  amoxicillin-clavulanate (AUGMENTIN) 875-125 MG tablet Take 1 tablet by mouth 2 (two) times daily for 10 days. 12/14/18 12/24/18  Lurene Shadow, PA-C  buPROPion (WELLBUTRIN XL) 300 MG 24 hr tablet  Take 1 tablet (300 mg total) by mouth daily. 08/22/18   Sunnie Nielsen, DO  Cetirizine HCl 10 MG CAPS Take 1 capsule (10 mg total) by mouth daily. 12/20/12   Floydene Flock, MD  dicyclomine (BENTYL) 20 MG tablet TAKE 1 TABLET BY MOUTH 4 TIMES DAILY BEFORE MEAL(S) AND AT BEDTIME 12/04/18   Sunnie Nielsen, DO  fluticasone [facility]) 50 MCG/ACT nasal spray Place 2 sprays into both nostrils daily. 03/17/14   Hommel, Sean, DO  fluticasone furoate-vilanterol (BREO ELLIPTA) 100-25 MCG/INH AEPB One inhalation daily. 08/22/18   Sunnie Nielsen, DO  ibuprofen (ADVIL,MOTRIN) 600 MG tablet Take 1 tablet (600 mg total) by mouth every 6 (six) hours as needed. 11/09/16   Lurene Shadow, PA-C  ipratropium (ATROVENT) 0.06 % nasal spray Place 2 sprays into both nostrils 4 (four) times daily. 08/10/18   Lurene Shadow, PA-C  ipratropium (ATROVENT) 0.06 % nasal spray Place 2 sprays into both nostrils 4 (four) times daily. 12/14/18   Lurene Shadow, PA-C  meloxicam (MOBIC) 15 MG tablet Take 15 mg by mouth daily.    [provider]  montelukast (SINGULAIR) 10 MG tablet Take 1 tablet (10 mg total) by mouth at bedtime. 08/22/18   Sunnie Nielsen, DO  omeprazole (PRILOSEC) 10 MG capsule Take 10 mg by mouth daily.    [provider]  predniSONE (DELTASONE) 20 MG tablet 3 tabs po day one, then 2 po daily  x 4 days 12/14/18   Rolla PlatePhelps, Heather Mckendree O, PA-C    Family History Family History  Problem Relation Age of Onset  . Asthma Mother   . COPD Mother   . Hypertension Father   . Hyperlipidemia Father   . Parkinsonism Father   . Alcoholism Unknown        grandfather  . Lung cancer Unknown        grandfather    Social History Social History   Tobacco Use  . Smoking status: Current Some Day Smoker  . Smokeless tobacco: Never Used  . Tobacco comment: Pt smokes 2 cigars/wk  Substance Use Topics  . Alcohol use: Yes  . Drug use: No     Allergies   Zoloft [sertraline hcl]   Review of  Systems Review of Systems  Constitutional: Positive for fever ( subjective). Negative for chills.  HENT: Positive for congestion and sinus pressure. Negative for ear pain and sore throat.   Respiratory: Positive for cough, chest tightness, shortness of breath and wheezing.   Gastrointestinal: Negative for constipation, diarrhea, nausea and vomiting.  Neurological: Positive for headaches. Negative for dizziness and light-headedness.     Physical Exam Triage Vital Signs ED Triage Vitals  Enc Vitals Group     BP 12/14/18 1557 (!) 137/37     Pulse Rate 12/14/18 1557 86     Resp 12/14/18 1557 (!) 21     Temp 12/14/18 1557 98.6 F (37 C)     Temp Source 12/14/18 1557 Oral     SpO2 12/14/18 1557 97 %     Weight 12/14/18 1558 186 lb (84.4 kg)     Height 12/14/18 1558 5\' 7"  (1.702 m)     Head Circumference --      Peak Flow --      Pain Score 12/14/18 1558 0     Pain Loc --      Pain Edu? --      Excl. in GC? --    No data found.  Updated Vital Signs BP (!) 137/37 (BP Location: Right Arm)   Pulse 86   Temp 98.6 F (37 C) (Oral)   Resp (!) 21   Ht 5\' 7"  (1.702 m)   Wt 186 lb (84.4 kg)   SpO2 97%   BMI 29.13 kg/m   Visual Acuity Right Eye Distance:   Left Eye Distance:   Bilateral Distance:    Right Eye Near:   Left Eye Near:    Bilateral Near:     Physical Exam Vitals signs and nursing note reviewed.  Constitutional:      Appearance: He is well-developed.  HENT:     Head: Normocephalic and atraumatic.     Right Ear: Tympanic membrane normal.     Left Ear: Tympanic membrane normal.     Nose:     Right Sinus: Maxillary sinus tenderness present. No frontal sinus tenderness.     Left Sinus: Maxillary sinus tenderness present. No frontal sinus tenderness.     Mouth/Throat:     Lips: Pink.     Mouth: Mucous membranes are moist.     Pharynx: Oropharynx is clear. Uvula midline.  Neck:     Musculoskeletal: Normal range of motion.  Cardiovascular:     Rate and  Rhythm: Normal rate and regular rhythm.  Pulmonary:     Effort: Pulmonary effort is normal. No respiratory distress.     Breath sounds: Wheezing and rhonchi present. No decreased breath sounds.  Comments: Diffuse wheeze and rhonchi Musculoskeletal: Normal range of motion.  Skin:    General: Skin is warm and dry.  Neurological:     Mental Status: He is alert and oriented to person, place, and time.  Psychiatric:        Behavior: Behavior normal.      UC Treatments / Results  Labs (all labs ordered are listed, but only abnormal results are displayed) Labs Reviewed - No data to display  EKG None  Radiology No results found.  Procedures Procedures (including critical care time)  Medications Ordered in UC Medications  methylPREDNISolone acetate (DEPO-MEDROL) injection 80 mg (80 mg Intramuscular Given 12/14/18 1638)  ipratropium-albuterol (DUONEB) 0.5-2.5 (3) MG/3ML nebulizer solution 3 mL (3 mLs Nebulization Given 12/14/18 1638)    Initial Impression / Assessment and Plan / UC Course  I have reviewed the triage vital signs and the nursing notes.  Pertinent labs & imaging results that were available during my care of the patient were reviewed by me and considered in my medical decision making (see chart for details).     Lungs improved after neb tx Will cover for secondary bacterial infection  Encouraged f/u with PCP and ENT for recurrent sinus infections.    Final Clinical Impressions(s) / UC Diagnoses   Final diagnoses:  Acute bronchitis, unspecified organism  Wheezing  Recurrent maxillary sinusitis     Discharge Instructions      You may take 500mg  acetaminophen every 4-6 hours or in combination with ibuprofen 400-600mg  every 6-8 hours as needed for pain, inflammation, and fever.  Be sure to well hydrated with clear liquids and get at least 8 hours of sleep at night, preferably more while sick.   Please follow up with family medicine in 1 week if  needed.     ED Prescriptions    Medication Sig Dispense Auth. Provider   amoxicillin-clavulanate (AUGMENTIN) 875-125 MG tablet Take 1 tablet by mouth 2 (two) times daily for 10 days. 20 tablet Doroteo Glassman, Kayveon Lennartz O, PA-C   predniSONE (DELTASONE) 20 MG tablet 3 tabs po day one, then 2 po daily x 4 days 11 tablet Hendrik Donath O, PA-C   albuterol (PROVENTIL HFA;VENTOLIN HFA) 108 (90 Base) MCG/ACT inhaler Inhale 1-2 puffs into the lungs every 6 (six) hours as needed for wheezing or shortness of breath. 1 Inhaler Doroteo Glassman, Macee Venables O, PA-C   ipratropium (ATROVENT) 0.06 % nasal spray Place 2 sprays into both nostrils 4 (four) times daily. 15 mL Lurene Shadow, PA-C     Controlled Substance Prescriptions  Controlled Substance Registry consulted? Not Applicable   Rolla Plate 12/14/18 1275

## 2018-12-14 NOTE — Discharge Instructions (Signed)
  You may take 500mg acetaminophen every 4-6 hours or in combination with ibuprofen 400-600mg every 6-8 hours as needed for pain, inflammation, and fever.  Be sure to well hydrated with clear liquids and get at least 8 hours of sleep at night, preferably more while sick.   Please follow up with family medicine in 1 week if needed.   

## 2018-12-14 NOTE — ED Triage Notes (Signed)
Mr. Grant Peterson presents today for sob, wheezing, productive cough, headache and congestion. He has been taking otc mucinex,tylenol, and motrin. Feels febrile but VS are within normal ranges. He reports that on and off his headache and sinus congestion goes back for 2 months. He does not show any S/S of distress at rest. No other concerns reported at this time. Of note, pt is a paramedic.

## 2019-01-31 ENCOUNTER — Encounter: Payer: Self-pay | Admitting: Osteopathic Medicine

## 2019-01-31 ENCOUNTER — Ambulatory Visit (INDEPENDENT_AMBULATORY_CARE_PROVIDER_SITE_OTHER): Payer: Managed Care, Other (non HMO) | Admitting: Osteopathic Medicine

## 2019-01-31 VITALS — BP 144/98 | HR 79 | Temp 98.2°F | Wt 184.5 lb

## 2019-01-31 DIAGNOSIS — R140 Abdominal distension (gaseous): Secondary | ICD-10-CM

## 2019-01-31 DIAGNOSIS — F329 Major depressive disorder, single episode, unspecified: Secondary | ICD-10-CM | POA: Diagnosis not present

## 2019-01-31 DIAGNOSIS — K2100 Gastro-esophageal reflux disease with esophagitis, without bleeding: Secondary | ICD-10-CM

## 2019-01-31 DIAGNOSIS — F1010 Alcohol abuse, uncomplicated: Secondary | ICD-10-CM | POA: Diagnosis not present

## 2019-01-31 DIAGNOSIS — F32A Depression, unspecified: Secondary | ICD-10-CM

## 2019-01-31 MED ORDER — PANTOPRAZOLE SODIUM 40 MG PO TBEC: 40.0000 mg | DELAYED_RELEASE_TABLET | Freq: Two times a day (BID) | ORAL | 1 refills | Status: DC

## 2019-01-31 MED ORDER — ALUM & MAG HYDROXIDE-SIMETH 200-200-20 MG/5ML PO SUSP
30.0000 mL | Freq: Once | ORAL | Status: AC
  Administered 2019-01-31: 30 mL via ORAL

## 2019-01-31 MED ORDER — VORTIOXETINE HBR 10 MG PO TABS: 10.0000 mg | ORAL_TABLET | Freq: Every day | ORAL | 5 refills | Status: DC

## 2019-01-31 MED ORDER — LIDOCAINE VISCOUS HCL 2 % MT SOLN
15.0000 mL | Freq: Once | OROMUCOSAL | Status: AC
  Administered 2019-01-31: 15 mL via OROMUCOSAL

## 2019-01-31 MED ORDER — HYOSCYAMINE SULFATE 0.125 MG SL SUBL
0.1250 mg | SUBLINGUAL_TABLET | Freq: Once | SUBLINGUAL | Status: AC
  Administered 2019-01-31: 0.125 mg via SUBLINGUAL

## 2019-01-31 MED ORDER — BUPROPION HCL ER (XL) 150 MG PO TB24: 150.0000 mg | ORAL_TABLET | Freq: Every day | ORAL | 1 refills | Status: DC

## 2019-01-31 NOTE — Patient Instructions (Signed)
Plan:  GI cocktail today  Will get labs today - checking liver function   Will start Pantoprazole for stomach acid   Will start Trintellix for moods  Please cut back/stop alcohol. You are low risk for withdrawal symptoms

## 2019-01-31 NOTE — Progress Notes (Signed)
HPI: Grant Peterson is a 48 y.o. male who  has a past medical history of Adult respiratory distress syndrome (HCC), Anxiety, Frequent sinus infections, and Reactive airway disease.  he presents to [facility] today, 01/31/19,  for chief complaint of:  Abdominal pain   Abdominal pain: Ongoing for 2 weeks, burning epigastric pain, no nausea or vomiting, no diarrhea or constipation, no blood in the stool.  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  History of PTSD.  Previously has been on Wellbutrin which does not really seem to be helping anymore, sleep on sertraline which caused flat affect and citalopram which did not work.  He reports that he is using alcohol more and more in the evenings to help self medicate and help with sleep.     At today's visit 01/31/19 ... PMH, PSH, FH reviewed and updated as needed.  Current medication list and allergy/intolerance hx reviewed and updated as needed. (See remainder of HPI, ROS, Phys Exam below)          ASSESSMENT/PLAN: The primary encounter diagnosis was Gastroesophageal reflux disease with esophagitis. Diagnoses of Depression, unspecified depression type, Abdominal bloating, and Alcohol use disorder, mild, abuse were also pertinent to this visit.   CIWA is 2, low risk for withdrawal   Orders Placed This Encounter  Procedures  . Ambulatory referral to Behavioral Health     Meds ordered this encounter  Medications  . vortioxetine HBr (TRINTELLIX) 10 MG TABS tablet    Sig: Take 1 tablet (10 mg total) by mouth daily.    Dispense:  30 tablet    Refill:  5  . pantoprazole (PROTONIX) 40 MG tablet    Sig: Take 1 tablet (40 mg total) by mouth 2 (two) times daily before a meal.    Dispense:  60 tablet    Refill:  1  . buPROPion (WELLBUTRIN XL) 150 MG 24 hr tablet    Sig: Take 1 tablet  (150 mg total) by mouth daily.    Dispense:  90 tablet    Refill:  1    Cancel 300 mg dose thanks!  Marland Kitchen alum & mag hydroxide-simeth (MAALOX/MYLANTA) 200-200-20 MG/5ML suspension 30 mL  . hyoscyamine (LEVSIN SL) SL tablet 0.125 mg  . lidocaine (XYLOCAINE) 2 % viscous mouth solution 15 mL    Patient Instructions  Plan:  GI cocktail today  Will get labs today - checking liver function   Will start Pantoprazole for stomach acid   Will start Trintellix for moods  Please cut back/stop alcohol. You are low risk for withdrawal symptoms     Patient is advised to let me know over the next couple of weeks how the stomach is doing, if the same or worse will refer to GI, possibly needs EGD   Follow-up plan: Return in about 4 weeks (around 02/28/2019) for recheck on new meidcations (trintellix, pantoprazole) see me sooner if needed .                                                 ################################################# ################################################# ################################################# #################################################    Current Meds  Medication Sig  . albuterol (PROVENTIL HFA;VENTOLIN HFA) 108 (90 Base) MCG/ACT inhaler Inhale 1-2 puffs into the lungs every 6 (six) hours as  needed for wheezing or shortness of breath.  Marland Kitchen albuterol (PROVENTIL HFA;VENTOLIN HFA) 108 (90 Base) MCG/ACT inhaler Inhale 1-2 puffs into the lungs every 6 (six) hours as needed for wheezing or shortness of breath.  Marland Kitchen buPROPion (WELLBUTRIN XL) 150 MG 24 hr tablet Take 1 tablet (150 mg total) by mouth daily.  . Cetirizine HCl 10 MG CAPS Take 1 capsule (10 mg total) by mouth daily.  Marland Kitchen dicyclomine (BENTYL) 20 MG tablet TAKE 1 TABLET BY MOUTH 4 TIMES DAILY BEFORE MEAL(S) AND AT BEDTIME  . fluticasone (FLONASE) 50 MCG/ACT nasal spray Place 2 sprays into both nostrils daily.  Marland Kitchen ibuprofen (ADVIL,MOTRIN) 600 MG tablet Take  1 tablet (600 mg total) by mouth every 6 (six) hours as needed.  Marland Kitchen ipratropium (ATROVENT) 0.06 % nasal spray Place 2 sprays into both nostrils 4 (four) times daily.  Marland Kitchen ipratropium (ATROVENT) 0.06 % nasal spray Place 2 sprays into both nostrils 4 (four) times daily.  . montelukast (SINGULAIR) 10 MG tablet Take 1 tablet (10 mg total) by mouth at bedtime.  Marland Kitchen omeprazole (PRILOSEC) 10 MG capsule Take 10 mg by mouth daily.  . [DISCONTINUED] buPROPion (WELLBUTRIN XL) 300 MG 24 hr tablet Take 1 tablet (300 mg total) by mouth daily.    Allergies  Allergen Reactions  . Zoloft [Sertraline Hcl] Other (See Comments)    Flat affect       Review of Systems:  Constitutional: No recent illness  Gastrointestinal: +abdominal pain, no change on bowel habits  Musculoskeletal: No new myalgia/arthralgia  Skin: No  Rash  Psychiatric: +concerns with depression, +concerns with anxiety  Exam:  BP (!) 144/98 (BP Location: Left Arm, Patient Position: Sitting, Cuff Size: Normal)   Pulse 79   Temp 98.2 F (36.8 C) (Oral)   Wt 184 lb 8 oz (83.7 kg)   BMI 28.90 kg/m   Constitutional: VS see above. General Appearance: alert, well-developed, well-nourished, NAD  Eyes: Normal lids and conjunctive, non-icteric sclera  Ears, Nose, Mouth, Throat: MMM, Normal external inspection ears/nares/mouth/lips/gums.  Neck: No masses, trachea midline.   Respiratory: Normal respiratory effort. no wheeze, no rhonchi, no rales  Cardiovascular: S1/S2 normal, no murmur, no rub/gallop auscultated. RRR.    Musculoskeletal: Gait normal. Symmetric and independent movement of all extremities  Abdominal: non-tender except in epigastrum, non-distended, no appreciable organomegaly, neg Murphy's, BS WNLx4  Neurological: Normal balance/coordination. No tremor.  Skin: warm, dry, intact.   Psychiatric: Normal judgment/insight. Normal mood and affect. Oriented x3.       Visit summary with medication list and pertinent  instructions was printed for patient to review, patient was advised to alert [place] if any updates are needed. All questions at time of visit were answered - patient instructed to contact office with any additional concerns. ER/RTC precautions were reviewed with the patient and understanding verbalized.      Please note: voice recognition software was used to produce this document, and typos may escape review. Please contact Dr. Lyn Hollingshead for any needed clarifications.    Follow up plan: Return in about 4 weeks (around 02/28/2019) for recheck on new meidcations (trintellix, pantoprazole) see me sooner if needed .

## 2019-02-01 ENCOUNTER — Other Ambulatory Visit: Payer: Self-pay | Admitting: Osteopathic Medicine

## 2019-02-01 DIAGNOSIS — Z113 Encounter for screening for infections with a predominantly sexual mode of transmission: Secondary | ICD-10-CM

## 2019-02-04 LAB — HEPATITIS B SURFACE ANTIBODY,QUALITATIVE: HEP B S AB: REACTIVE — AB

## 2019-02-04 LAB — COMPLETE METABOLIC PANEL WITH GFR
AG RATIO: 1.9 (calc) (ref 1.0–2.5)
ALBUMIN MSPROF: 4.9 g/dL (ref 3.6–5.1)
ALT: 19 U/L (ref 9–46)
AST: 15 U/L (ref 10–40)
Alkaline phosphatase (APISO): 63 U/L (ref 36–130)
BILIRUBIN TOTAL: 0.8 mg/dL (ref 0.2–1.2)
BUN: 16 mg/dL (ref 7–25)
CALCIUM: 9.8 mg/dL (ref 8.6–10.3)
CHLORIDE: 104 mmol/L (ref 98–110)
CO2: 25 mmol/L (ref 20–32)
Creat: 1.22 mg/dL (ref 0.60–1.35)
GFR, EST AFRICAN AMERICAN: 82 mL/min/{1.73_m2} (ref >=60)
GFR, Est Non African American: 71 mL/min/{1.73_m2} (ref >=60)
GLUCOSE: 95 mg/dL (ref 65–99)
Globulin: 2.6 g/dL (calc) (ref 1.9–3.7)
POTASSIUM: 4.3 mmol/L (ref 3.5–5.3)
Sodium: 139 mmol/L (ref 135–146)
TOTAL PROTEIN: 7.5 g/dL (ref 6.1–8.1)

## 2019-02-04 LAB — LIPID PANEL
CHOLESTEROL: 225 mg/dL — AB (ref <200)
HDL: 51 mg/dL (ref >=40)
LDL Cholesterol (Calc): 139 mg/dL (calc) — ABNORMAL HIGH
Non-HDL Cholesterol (Calc): 174 mg/dL (calc) — ABNORMAL HIGH (ref <130)
Total CHOL/HDL Ratio: 4.4 (calc) (ref <5.0)
Triglycerides: 212 mg/dL — ABNORMAL HIGH (ref <150)

## 2019-02-04 LAB — HEPATITIS B SURFACE ANTIGEN: Hepatitis B Surface Ag: NONREACTIVE

## 2019-02-04 LAB — CBC
HCT: 46.2 % (ref 38.5–50.0)
Hemoglobin: 16.5 g/dL (ref 13.2–17.1)
MCH: 32 pg (ref 27.0–33.0)
MCHC: 35.7 g/dL (ref 32.0–36.0)
MCV: 89.5 fL (ref 80.0–100.0)
MPV: 10.9 fL (ref 7.5–12.5)
PLATELETS: 297 10*3/uL (ref 140–400)
RBC: 5.16 10*6/uL (ref 4.20–5.80)
RDW: 12.6 % (ref 11.0–15.0)
WBC: 4.7 10*3/uL (ref 3.8–10.8)

## 2019-02-04 LAB — RPR: RPR: NONREACTIVE

## 2019-02-04 LAB — TEST AUTHORIZATION

## 2019-02-04 LAB — HEPATITIS C ANTIBODY
HEP C AB: NONREACTIVE
SIGNAL TO CUT-OFF: 0.01 (ref <1.00)

## 2019-02-04 LAB — HIV ANTIBODY (ROUTINE TESTING W REFLEX): HIV 1&2 Ab, 4th Generation: NONREACTIVE

## 2019-02-04 LAB — TSH: TSH: 3.76 m[IU]/L (ref 0.40–4.50)

## 2019-02-04 LAB — HEPATITIS B CORE ANTIBODY, TOTAL: HEP B C TOTAL AB: NONREACTIVE

## 2019-02-06 ENCOUNTER — Telehealth: Payer: Self-pay

## 2019-02-06 NOTE — Telephone Encounter (Signed)
Pt called requesting an appt to be seen by Newman Nip. A referral was placed by Dr. Lyn Hollingshead at their last appt. Pls contact pt for scheduling, thanks!

## 2019-02-08 NOTE — Telephone Encounter (Signed)
Grant Peterson is aware and has taken care of it. Thank you

## 2019-02-27 ENCOUNTER — Ambulatory Visit (INDEPENDENT_AMBULATORY_CARE_PROVIDER_SITE_OTHER): Payer: Managed Care, Other (non HMO) | Admitting: Osteopathic Medicine

## 2019-02-27 ENCOUNTER — Other Ambulatory Visit: Payer: Self-pay

## 2019-02-27 ENCOUNTER — Encounter: Payer: Self-pay | Admitting: Osteopathic Medicine

## 2019-02-27 DIAGNOSIS — J019 Acute sinusitis, unspecified: Secondary | ICD-10-CM | POA: Diagnosis not present

## 2019-02-27 DIAGNOSIS — F329 Major depressive disorder, single episode, unspecified: Secondary | ICD-10-CM | POA: Diagnosis not present

## 2019-02-27 DIAGNOSIS — K2100 Gastro-esophageal reflux disease with esophagitis, without bleeding: Secondary | ICD-10-CM

## 2019-02-27 DIAGNOSIS — F32A Depression, unspecified: Secondary | ICD-10-CM

## 2019-02-27 MED ORDER — AMOXICILLIN-POT CLAVULANATE 875-125 MG PO TABS: 1.0000 | ORAL_TABLET | Freq: Two times a day (BID) | ORAL | 0 refills | Status: AC

## 2019-02-27 NOTE — Progress Notes (Signed)
Virtual Visit  via Video or Phone Note  I connected with      Grant Peterson on 02/27/19 at 3:40 by a telemedicine application and verified that I am speaking with the correct person using two identifiers.   I discussed the limitations of evaluation and management by telemedicine and the availability of in person appointments. The patient expressed understanding and agreed to proceed.  History of Present Illness: Grant Peterson is a 48 y.o. male who would like to discuss sinus issues, recheck depression and GERD  Sinuses: 2 weeks of nasty mucus, sinus congestion, He has been consistent about his allergy meds including antihistamines and nasal steroid sprays, w atrovent as needed.   Has not yet started the Trintellix, misplaced the savings card for it. Found this though and will be picking it up today. Feels like he is managing okay, no severe symptoms.   GERD: protonix is working great, he is taking bid o most days but doing fine on daily dose as well when he forgets the second dose.   Depression screen [facility] 2/9 02/27/2019 08/22/2018  Decreased Interest 2 2  Down, Depressed, Hopeless 3 2  PHQ - 2 Score 5 4  Altered sleeping 3 3  Tired, decreased energy 2 3  Change in appetite 0 0  Feeling bad or failure about yourself  2 1  Trouble concentrating 2 2  Moving slowly or fidgety/restless 0 1  Suicidal thoughts 0 0  PHQ-9 Score 14 14  Difficult doing work/chores Somewhat difficult Very difficult     Pantoprazole has been helping a lot.    Observations/Objective: There were no vitals taken for this visit. BP Readings from Last 3 Encounters:  01/31/19 (!) 144/98  12/14/18 (!) 137/37  08/22/18 (!) 137/91   Exam: Normal Speech. Normal affect.   Lab and Radiology Results No results found for this or any previous visit (from the past 72 hour(s)). No results found.     Assessment and Plan: 48 y.o. male with The primary encounter diagnosis was Gastroesophageal reflux  disease with esophagitis. Diagnoses of Depression, unspecified depression type and Acute non-recurrent sinusitis, unspecified location were also pertinent to this visit.   PDMP not reviewed this encounter. No orders of the defined types were placed in this encounter.  Abx for sinusitis Continue Protonix, consider transition to daily dose, continue 4-6 weeks then d/c and reevaluate Let me know if any issues w/ Trintellix!   Meds ordered this encounter  Medications  . amoxicillin-clavulanate (AUGMENTIN) 875-125 MG tablet    Sig: Take 1 tablet by mouth 2 (two) times daily for 7 days.    Dispense:  14 tablet    Refill:  0   There are no Patient Instructions on file for this visit. Instructions sent via MyChart. If MyChart not available, pt was given option for info via personal e-mail w/ no guarantee of protected health info over unsecured e-mail communication, and MyChart sign-up instructions were included.   Follow Up Instructions: Return in about 4 weeks (around 03/27/2019) for recheck on Trintellix .    I discussed the assessment and treatment plan with the patient. The patient was provided an opportunity to ask questions and all were answered. The patient agreed with the plan and demonstrated an understanding of the instructions.   The patient was advised to call back or seek an in-person evaluation if the symptoms worsen or if the condition fails to improve as anticipated.  I provided 15 minutes of non-face-to-face time during this  encounter.                      Historical information moved to improve visibility of documentation.  Past Medical History:  Diagnosis Date  . Adult respiratory distress syndrome (HCC)   . Anxiety   . Frequent sinus infections   . Reactive airway disease    Past Surgical History:  Procedure Laterality Date  . BUNIONECTOMY  04/2008   right   Social History   Tobacco Use  . Smoking status: Current Some Day Smoker  . Smokeless  tobacco: Never Used  . Tobacco comment: Pt smokes 1 - 2 cigars/month  Substance Use Topics  . Alcohol use: Yes   family history includes Alcoholism in his unknown relative; Asthma in his mother; COPD in his mother; Hyperlipidemia in his father; Hypertension in his father; Lung cancer in his unknown relative; Parkinsonism in his father.  Medications: Current Outpatient Medications  Medication Sig Dispense Refill  . albuterol (PROVENTIL HFA;VENTOLIN HFA) 108 (90 Base) MCG/ACT inhaler Inhale 1-2 puffs into the lungs every 6 (six) hours as needed for wheezing or shortness of breath. 2 Inhaler 11  . buPROPion (WELLBUTRIN XL) 150 MG 24 hr tablet Take 1 tablet (150 mg total) by mouth daily. 90 tablet 1  . Cetirizine HCl 10 MG CAPS Take 1 capsule (10 mg total) by mouth daily. 30 capsule 3  . dicyclomine (BENTYL) 20 MG tablet TAKE 1 TABLET BY MOUTH 4 TIMES DAILY BEFORE MEAL(S) AND AT BEDTIME 90 tablet 0  . fluticasone (FLONASE) 50 MCG/ACT nasal spray Place 2 sprays into both nostrils daily. 16 g 12  . fluticasone furoate-vilanterol (BREO ELLIPTA) 100-25 MCG/INH AEPB One inhalation daily. 60 each 11  . ibuprofen (ADVIL,MOTRIN) 600 MG tablet Take 1 tablet (600 mg total) by mouth every 6 (six) hours as needed. 30 tablet 0  . ipratropium (ATROVENT) 0.06 % nasal spray Place 2 sprays into both nostrils 4 (four) times daily. 15 mL 1  . meloxicam (MOBIC) 15 MG tablet Take 15 mg by mouth daily.    . montelukast (SINGULAIR) 10 MG tablet Take 1 tablet (10 mg total) by mouth at bedtime. 90 tablet 3  . omeprazole (PRILOSEC) 10 MG capsule Take 10 mg by mouth daily.    . pantoprazole (PROTONIX) 40 MG tablet Take 1 tablet (40 mg total) by mouth 2 (two) times daily before a meal. 60 tablet 1  . predniSONE (DELTASONE) 20 MG tablet 3 tabs po day one, then 2 po daily x 4 days 11 tablet 0  . vortioxetine HBr (TRINTELLIX) 10 MG TABS tablet Take 1 tablet (10 mg total) by mouth daily. 30 tablet 5  . albuterol (PROVENTIL  HFA;VENTOLIN HFA) 108 (90 Base) MCG/ACT inhaler Inhale 1-2 puffs into the lungs every 6 (six) hours as needed for wheezing or shortness of breath. 1 Inhaler 0  . amoxicillin-clavulanate (AUGMENTIN) 875-125 MG tablet Take 1 tablet by mouth 2 (two) times daily for 7 days. 14 tablet 0  . ipratropium (ATROVENT) 0.06 % nasal spray Place 2 sprays into both nostrils 4 (four) times daily. 15 mL 1   No current facility-administered medications for this visit.    Allergies  Allergen Reactions  . Zoloft [Sertraline Hcl] Other (See Comments)    Flat affect    PDMP not reviewed this encounter. No orders of the defined types were placed in this encounter.  Meds ordered this encounter  Medications  . amoxicillin-clavulanate (AUGMENTIN) 875-125 MG tablet    Sig:  Take 1 tablet by mouth 2 (two) times daily for 7 days.    Dispense:  14 tablet    Refill:  0

## 2019-02-28 NOTE — Addendum Note (Signed)
Addended by: Deirdre Pippins on: 02/28/2019 01:25 PM   Modules accepted: Level of Service

## 2019-03-28 ENCOUNTER — Ambulatory Visit: Payer: 59 | Admitting: Psychology

## 2019-04-22 ENCOUNTER — Ambulatory Visit (INDEPENDENT_AMBULATORY_CARE_PROVIDER_SITE_OTHER): Payer: Managed Care, Other (non HMO) | Admitting: Osteopathic Medicine

## 2019-04-22 VITALS — BP 132/76 | HR 84 | Temp 98.2°F | Wt 184.0 lb

## 2019-04-22 DIAGNOSIS — J329 Chronic sinusitis, unspecified: Secondary | ICD-10-CM

## 2019-04-22 DIAGNOSIS — J302 Other seasonal allergic rhinitis: Secondary | ICD-10-CM

## 2019-04-22 MED ORDER — DOXYCYCLINE HYCLATE 100 MG PO TABS: 100.0000 mg | ORAL_TABLET | Freq: Two times a day (BID) | ORAL | 0 refills | Status: DC

## 2019-04-22 MED ORDER — IPRATROPIUM BROMIDE 0.06 % NA SOLN: 2.0000 | Freq: Four times a day (QID) | NASAL | 1 refills | Status: DC

## 2019-04-22 MED ORDER — DICYCLOMINE HCL 20 MG PO TABS: 20.0000 mg | ORAL_TABLET | Freq: Three times a day (TID) | ORAL | 0 refills | Status: DC

## 2019-04-22 MED ORDER — FLUTICASONE PROPIONATE 50 MCG/ACT NA SUSP: 2.0000 | Freq: Every day | NASAL | 12 refills | Status: DC

## 2019-04-22 MED ORDER — PREDNISONE 20 MG PO TABS: ORAL_TABLET | ORAL | 0 refills | Status: DC

## 2019-04-22 NOTE — Progress Notes (Signed)
Virtual Visit via Video (App used: Doximity) Note  I connected with      Grant Peterson on 04/22/19 at 4:00 by a telemedicine application and verified that I am speaking with the correct person using two identifiers.  Patient is at home I am working from home   I discussed the limitations of evaluation and management by telemedicine and the availability of in person appointments. The patient expressed understanding and agreed to proceed.  History of Present Illness: Grant Peterson is a 48 y.o. male who would like to discuss  Chief Complaint  Patient presents with  . Medication Refill  . Sinusitis    2 mths - never resolved    Was treated 02/27/2019 for sinusitis, pt reports not much better. We tried augmentin, as well as atrovent, steroid taper. Still having some nasty mucus, pressure, sinus pain, nasal tone to his voice.  GERD - we discussed daily Protonix for 4-6 weeks then d/c and recheck. He reports      Observations/Objective: BP 132/76 (BP Location: Left Arm, Patient Position: Sitting, Cuff Size: Normal)   Pulse 84   Temp 98.2 F (36.8 C) (Oral)   Wt 184 lb (83.5 kg)   BMI 28.82 kg/m  BP Readings from Last 3 Encounters:  04/22/19 132/76  01/31/19 (!) 144/98  12/14/18 (!) 137/37   Exam: Normal Speech.  NAD  Lab and Radiology Results No results found for this or any previous visit (from the past 72 hour(s)). No results found.     Assessment and Plan: 48 y.o. male with The primary encounter diagnosis was Chronic sinusitis, unspecified location. A diagnosis of Seasonal allergies was also pertinent to this visit.  Will escalate antibiotics to doxycycline, if not better toward the end of the week, patient is advised to call and let us know, would consider CT of the sinuses/possible ENT referral.   PDMP not reviewed this encounter. No orders of the defined types were placed in this encounter.  Meds ordered this encounter  Medications  .  dicyclomine (BENTYL) 20 MG tablet    Sig: Take 1 tablet (20 mg total) by mouth 3 (three) times daily before meals. As needed for abdominal spasm    Dispense:  90 tablet    Refill:  0    Pt needs f/u appt with provider regarding bloating/stomach issues  . fluticasone (FLONASE) 50 MCG/ACT nasal spray    Sig: Place 2 sprays into both nostrils daily.    Dispense:  16 g    Refill:  12  . ipratropium (ATROVENT) 0.06 % nasal spray    Sig: Place 2 sprays into both nostrils 4 (four) times daily.    Dispense:  15 mL    Refill:  1  . predniSONE (DELTASONE) 20 MG tablet    Sig: 3 tabs po day one, then 2 po daily x 4 days    Dispense:  11 tablet    Refill:  0  . doxycycline (VIBRA-TABS) 100 MG tablet    Sig: Take 1 tablet (100 mg total) by mouth 2 (two) times daily.    Dispense:  14 tablet    Refill:  0    Follow Up Instructions: Return if symptoms worsen or fail to improve.    I discussed the assessment and treatment plan with the patient. The patient was provided an opportunity to ask questions and all were answered. The patient agreed with the plan and demonstrated an understanding of the instructions.   The patient was advised  to call back or seek an in-person evaluation if any new concerns, if symptoms worsen or if the condition fails to improve as anticipated.  15 minutes of non-face-to-face time was provided during this encounter.                      Historical information moved to improve visibility of documentation.  Past Medical History:  Diagnosis Date  . Adult respiratory distress syndrome (HCC)   . Anxiety   . Frequent sinus infections   . Reactive airway disease    Past Surgical History:  Procedure Laterality Date  . BUNIONECTOMY  04/2008   right   Social History   Tobacco Use  . Smoking status: Current Some Day Smoker  . Smokeless tobacco: Never Used  . Tobacco comment: Pt smokes 1 - 2 cigars/month  Substance Use Topics  . Alcohol use: Yes    family history includes Alcoholism in his unknown relative; Asthma in his mother; COPD in his mother; Hyperlipidemia in his father; Hypertension in his father; Lung cancer in his unknown relative; Parkinsonism in his father.  Medications: Current Outpatient Medications  Medication Sig Dispense Refill  . albuterol (PROVENTIL HFA;VENTOLIN HFA) 108 (90 Base) MCG/ACT inhaler Inhale 1-2 puffs into the lungs every 6 (six) hours as needed for wheezing or shortness of breath. 2 Inhaler 11  . buPROPion (WELLBUTRIN XL) 150 MG 24 hr tablet Take 1 tablet (150 mg total) by mouth daily. 90 tablet 1  . Cetirizine HCl 10 MG CAPS Take 1 capsule (10 mg total) by mouth daily. 30 capsule 3  . dicyclomine (BENTYL) 20 MG tablet Take 1 tablet (20 mg total) by mouth 3 (three) times daily before meals. As needed for abdominal spasm 90 tablet 0  . fluticasone (FLONASE) 50 MCG/ACT nasal spray Place 2 sprays into both nostrils daily. 16 g 12  . fluticasone furoate-vilanterol (BREO ELLIPTA) 100-25 MCG/INH AEPB One inhalation daily. 60 each 11  . ibuprofen (ADVIL,MOTRIN) 600 MG tablet Take 1 tablet (600 mg total) by mouth every 6 (six) hours as needed. 30 tablet 0  . ipratropium (ATROVENT) 0.06 % nasal spray Place 2 sprays into both nostrils 4 (four) times daily. 15 mL 1  . montelukast (SINGULAIR) 10 MG tablet Take 1 tablet (10 mg total) by mouth at bedtime. 90 tablet 3  . omeprazole (PRILOSEC) 10 MG capsule Take 10 mg by mouth daily.    Marland Kitchen vortioxetine HBr (TRINTELLIX) 10 MG TABS tablet Take 1 tablet (10 mg total) by mouth daily. 30 tablet 5  . albuterol (PROVENTIL HFA;VENTOLIN HFA) 108 (90 Base) MCG/ACT inhaler Inhale 1-2 puffs into the lungs every 6 (six) hours as needed for wheezing or shortness of breath. 1 Inhaler 0  . doxycycline (VIBRA-TABS) 100 MG tablet Take 1 tablet (100 mg total) by mouth 2 (two) times daily. 14 tablet 0  . meloxicam (MOBIC) 15 MG tablet Take 15 mg by mouth daily.    . pantoprazole (PROTONIX) 40  MG tablet Take 1 tablet (40 mg total) by mouth 2 (two) times daily before a meal. (Patient not taking: Reported on 04/22/2019) 60 tablet 1  . predniSONE (DELTASONE) 20 MG tablet 3 tabs po day one, then 2 po daily x 4 days 11 tablet 0   No current facility-administered medications for this visit.    Allergies  Allergen Reactions  . Zoloft [Sertraline Hcl] Other (See Comments)    Flat affect    PDMP not reviewed this encounter. No orders of the  defined types were placed in this encounter.  Meds ordered this encounter  Medications  . dicyclomine (BENTYL) 20 MG tablet    Sig: Take 1 tablet (20 mg total) by mouth 3 (three) times daily before meals. As needed for abdominal spasm    Dispense:  90 tablet    Refill:  0    Pt needs f/u appt with provider regarding bloating/stomach issues  . fluticasone (FLONASE) 50 MCG/ACT nasal spray    Sig: Place 2 sprays into both nostrils daily.    Dispense:  16 g    Refill:  12  . ipratropium (ATROVENT) 0.06 % nasal spray    Sig: Place 2 sprays into both nostrils 4 (four) times daily.    Dispense:  15 mL    Refill:  1  . predniSONE (DELTASONE) 20 MG tablet    Sig: 3 tabs po day one, then 2 po daily x 4 days    Dispense:  11 tablet    Refill:  0  . doxycycline (VIBRA-TABS) 100 MG tablet    Sig: Take 1 tablet (100 mg total) by mouth 2 (two) times daily.    Dispense:  14 tablet    Refill:  0

## 2019-04-23 ENCOUNTER — Encounter: Payer: Self-pay | Admitting: Osteopathic Medicine

## 2019-06-09 ENCOUNTER — Emergency Department
Admission: EM | Admit: 2019-06-09 | Discharge: 2019-06-09 | Disposition: A | Discharge status: Home / Self Care | Payer: Managed Care, Other (non HMO) | Source: Home / Self Care | Attending: Family Medicine | Admitting: Family Medicine

## 2019-06-09 ENCOUNTER — Other Ambulatory Visit: Payer: Self-pay

## 2019-06-09 DIAGNOSIS — J3089 Other allergic rhinitis: Secondary | ICD-10-CM | POA: Diagnosis not present

## 2019-06-09 DIAGNOSIS — J0101 Acute recurrent maxillary sinusitis: Secondary | ICD-10-CM

## 2019-06-09 MED ORDER — PREDNISONE 20 MG PO TABS: ORAL_TABLET | ORAL | 0 refills | Status: DC

## 2019-06-09 MED ORDER — METHYLPREDNISOLONE SODIUM SUCC 125 MG IJ SOLR
80.0000 mg | Freq: Once | INTRAMUSCULAR | Status: AC
  Administered 2019-06-09: 80 mg via INTRAMUSCULAR

## 2019-06-09 MED ORDER — DOXYCYCLINE HYCLATE 100 MG PO TABS: 100.0000 mg | ORAL_TABLET | Freq: Two times a day (BID) | ORAL | 0 refills | Status: DC

## 2019-06-09 NOTE — Discharge Instructions (Addendum)
Begin prednisone Monday 06/10/19. May continue Pseudoephedrine (30mg , one or two every 4 to 6 hours) for sinus congestion.  Get adequate rest.   Continue all present medications.

## 2019-06-09 NOTE — ED Triage Notes (Signed)
Pt c/o sinus pain/pressure x 2 mos. No relief with OTC meds including tylenol and afrin nasal spray. Hx of frequent sinus issues. Denies cough and fever.

## 2019-06-09 NOTE — ED Provider Notes (Signed)
[facility]    CSN: [id number] Arrival date & time: 06/09/19  1408     History   Chief Complaint Chief Complaint  Patient presents with  . Recurrent Sinusitis    HPI Hazaiah Edgecombe is a 48 y.o. male.   Patient has a history of perennial rhinitis, nasal polyps, and multiple episodes of sinusitis in the past.  During the past two months he has had persistent increased sinus congestion and facial pain that has not improved with his usual medications.  He denies sore throat, cough, fevers, chills, and sweats.  The history is provided by the patient.    Past Medical History:  Diagnosis Date  . Adult respiratory distress syndrome ([place])   . Anxiety   . Frequent sinus infections   . Reactive airway disease     Patient Active Problem List   Diagnosis Date Noted  . Abdominal bloating 08/22/2018  . Cervical radiculopathy 12/07/2016  . Hyperlipidemia 02/05/2015  . Hypertriglyceridemia 11/20/2014  . Depression 03/17/2014  . Asthma, chronic 03/17/2014  . Seasonal allergies 03/17/2014  . Inhalation injury 03/17/2014  . Constipation 03/17/2014    Past Surgical History:  Procedure Laterality Date  . BUNIONECTOMY  04/2008   right       Home Medications    Prior to Admission medications   Medication Sig Start Date End Date Taking? Authorizing Provider  albuterol (PROVENTIL HFA;VENTOLIN HFA) 108 (90 Base) MCG/ACT inhaler Inhale 1-2 puffs into the lungs every 6 (six) hours as needed for wheezing or shortness of breath. 08/22/18   Emeterio Reeve, DO  albuterol (PROVENTIL HFA;VENTOLIN HFA) 108 (90 Base) MCG/ACT inhaler Inhale 1-2 puffs into the lungs every 6 (six) hours as needed for wheezing or shortness of breath. 12/14/18   Noe Gens, PA-C  buPROPion (WELLBUTRIN XL) 150 MG 24 hr tablet Take 1 tablet (150 mg total) by mouth daily. 01/31/19   Emeterio Reeve, DO  Cetirizine HCl 10 MG CAPS Take 1 capsule (10 mg total) by mouth daily. 12/20/12   Deneise Lever, MD  dicyclomine (BENTYL) 20 MG tablet Take 1 tablet (20 mg total) by mouth 3 (three) times daily before meals. As needed for abdominal spasm 04/22/19   Emeterio Reeve, DO  doxycycline (VIBRA-TABS) 100 MG tablet Take 1 tablet (100 mg total) by mouth 2 (two) times daily. Take with food. 06/09/19   Kandra Nicolas, MD  fluticasone (FLONASE) 50 MCG/ACT nasal spray Place 2 sprays into both nostrils daily. 04/22/19   Emeterio Reeve, DO  fluticasone furoate-vilanterol (BREO ELLIPTA) 100-25 MCG/INH AEPB One inhalation daily. 08/22/18   Emeterio Reeve, DO  ibuprofen (ADVIL,MOTRIN) 600 MG tablet Take 1 tablet (600 mg total) by mouth every 6 (six) hours as needed. 11/09/16   Noe Gens, PA-C  ipratropium (ATROVENT) 0.06 % nasal spray Place 2 sprays into both nostrils 4 (four) times daily. 04/22/19   Emeterio Reeve, DO  meloxicam (MOBIC) 15 MG tablet Take 15 mg by mouth daily.    [provider]  montelukast (SINGULAIR) 10 MG tablet Take 1 tablet (10 mg total) by mouth at bedtime. 08/22/18   Emeterio Reeve, DO  omeprazole (PRILOSEC) 10 MG capsule Take 10 mg by mouth daily.    [provider]  pantoprazole (PROTONIX) 40 MG tablet Take 1 tablet (40 mg total) by mouth 2 (two) times daily before a meal. Patient not taking: Reported on 04/22/2019 01/31/19   Emeterio Reeve, DO  predniSONE (DELTASONE) 20 MG tablet Take one tab by mouth  twice daily for 5 days, then one daily for 3 days. Take with food. 06/09/19   Lattie HawBeese, Avah Bashor A, MD  vortioxetine HBr (TRINTELLIX) 10 MG TABS tablet Take 1 tablet (10 mg total) by mouth daily. 01/31/19   Sunnie NielsenAlexander, Natalie, DO    Family History Family History  Problem Relation Age of Onset  . Asthma Mother   . COPD Mother   . Hypertension Father   . Hyperlipidemia Father   . Parkinsonism Father   . Alcoholism Other        grandfather  . Lung cancer Other        grandfather    Social History Social History   Tobacco Use  . Smoking  status: Current Some Day Smoker  . Smokeless tobacco: Never Used  . Tobacco comment: Pt smokes 1 - 2 cigars/month  Substance Use Topics  . Alcohol use: Yes  . Drug use: No     Allergies   Zoloft [sertraline hcl]   Review of Systems Review of Systems No sore throat No cough No pleuritic pain No wheezing + nasal congestion + post-nasal drainage + sinus pain/pressure No itchy/red eyes No earache No hemoptysis No SOB No fever/chills No nausea No vomiting No abdominal pain No diarrhea No urinary symptoms No skin rash No fatigue No myalgias + headache Used OTC meds without relief   Physical Exam Triage Vital Signs ED Triage Vitals  Enc Vitals Group     BP 06/09/19 1428 (!) 157/105     Pulse Rate 06/09/19 1428 83     Resp 06/09/19 1428 16     Temp --      Temp src --      SpO2 06/09/19 1428 97 %     Weight --      Height --      Head Circumference --      Peak Flow --      Pain Score 06/09/19 1429 0     Pain Loc --      Pain Edu? --      Excl. in GC? --    No data found.  Updated Vital Signs BP (!) 157/105 (BP Location: Left Arm)   Pulse 83   Resp 16   SpO2 97%   Visual Acuity Right Eye Distance:   Left Eye Distance:   Bilateral Distance:    Right Eye Near:   Left Eye Near:    Bilateral Near:     Physical Exam Nursing notes and Vital Signs reviewed. Appearance:  Patient appears stated age, and in no acute distress Eyes:  Pupils are equal, round, and reactive to light and accomodation.  Extraocular movement is intact.  Conjunctivae are not inflamed  Ears:  Canals normal.  Tympanic membranes normal.  Nose:  Congested turbinates.  Maxillary sinus tenderness is present.  Pharynx:  Normal Neck:  Supple.  Nontender mildly enlarged posterior/lateral nodes are palpated bilaterally. Lungs:  Clear to auscultation.  Breath sounds are equal.  Moving air well. Heart:  Regular rate and rhythm without murmurs, rubs, or gallops.  Abdomen:  Nontender  without masses or hepatosplenomegaly.  Bowel sounds are present.  No CVA or flank tenderness.  Extremities:  No edema.  Skin:  No rash present.    UC Treatments / Results  Labs (all labs ordered are listed, but only abnormal results are displayed) Labs Reviewed - No data to display  EKG   Radiology No results found.  Procedures Procedures (including critical care time)  Medications  Ordered in UC Medications  methylPREDNISolone sodium succinate (SOLU-MEDROL) 125 mg/2 mL injection 80 mg (has no administration in time range)    Initial Impression / Assessment and Plan / UC Course  I have reviewed the triage vital signs and the nursing notes.  Pertinent labs & imaging results that were available during my care of the patient were reviewed by me and considered in my medical decision making (see chart for details).    Administered Solumedrol 80mg  IM, then begin prednisone burst/taper. Begin doxycycline. Followup with Family Doctor if not improved in about 10 days.   Final Clinical Impressions(s) / UC Diagnoses   Final diagnoses:  Perennial allergic rhinitis  Acute recurrent maxillary sinusitis     Discharge Instructions     Begin prednisone Monday 06/10/19. May continue Pseudoephedrine (30mg , one or two every 4 to 6 hours) for sinus congestion.  Get adequate rest.   Continue all present medications.    ED Prescriptions    Medication Sig Dispense Auth. Provider   doxycycline (VIBRA-TABS) 100 MG tablet Take 1 tablet (100 mg total) by mouth 2 (two) times daily. Take with food. 20 tablet Lattie HawBeese, Shavaun Osterloh A, MD   predniSONE (DELTASONE) 20 MG tablet Take one tab by mouth twice daily for 5 days, then one daily for 3 days. Take with food. 13 tablet Lattie HawBeese, Holmes Hays A, MD        Lattie HawBeese, Josip Merolla A, MD 06/09/19 2010

## 2019-06-27 ENCOUNTER — Other Ambulatory Visit: Payer: Self-pay | Admitting: Osteopathic Medicine

## 2019-06-27 NOTE — Telephone Encounter (Signed)
Requested Prescriptions  Pending Prescriptions Disp Refills  . dicyclomine (BENTYL) 20 MG tablet [Pharmacy Med Name: Dicyclomine HCl 20 MG Oral Tablet] 90 tablet 0    Sig: TAKE 1 TABLET BY MOUTH THREE TIMES DAILY BEFORE MEAL(S) AS NEEDED FOR  ABDOMINAL  SPASM     Gastroenterology:  Antispasmodic Agents Failed - 06/27/2019  9:56 AM      Failed - Valid encounter within last 12 months    Recent Outpatient Visits          2 months ago Chronic sinusitis, unspecified location   [facility], [place], [place]   4 months ago Gastroesophageal reflux disease with esophagitis   [place] [facility], Lanelle Bal, DO   4 months ago Gastroesophageal reflux disease with esophagitis   [place] [facility], Lanelle Bal, DO   10 months ago Annual physical exam   [facility], Lanelle Bal, DO   2 years ago Coughing up blood   [facility], [place], DO             Passed - Last Heart Rate in normal range    Pulse Readings from Last 1 Encounters:  06/09/19 83         Patient had telemedicine follow up on 04/22/2019 with Dr. Sheppard Coil.

## 2019-08-12 ENCOUNTER — Other Ambulatory Visit: Payer: Self-pay | Admitting: Osteopathic Medicine

## 2019-11-15 ENCOUNTER — Other Ambulatory Visit: Payer: Self-pay | Admitting: Osteopathic Medicine

## 2019-11-15 NOTE — Telephone Encounter (Signed)
Requested medication (s) are due for refill today: yes  Requested medication (s) are on the active medication list: yes  Last refill: 06/29/2019  Future visit scheduled: no  Notes to clinic:  review for refill   Requested Prescriptions  Pending Prescriptions Disp Refills   dicyclomine (BENTYL) 20 MG tablet [Pharmacy Med Name: Dicyclomine HCl 20 MG Oral Tablet] 90 tablet 0    Sig: TAKE 1 TABLET BY MOUTH THREE TIMES DAILY BEFORE MEAL(S) AS NEEDED FOR  ABDOMINAL  SPASM      Gastroenterology:  Antispasmodic Agents Failed - 11/15/2019 11:35 AM      Failed - Valid encounter within last 12 months    Recent Outpatient Visits           6 months ago Chronic sinusitis, unspecified location   [facility], [place] Bal, DO   8 months ago Gastroesophageal reflux disease with esophagitis   [place] [facility], Lanelle Bal, DO   9 months ago Gastroesophageal reflux disease with esophagitis   [place] [facility] [place], Lanelle Bal, DO   1 year ago Annual physical exam   [facility], Lanelle Bal, DO   2 years ago Coughing up blood   [facility] [place], DO              Passed - Last Heart Rate in normal range    Pulse Readings from Last 1 Encounters:  06/09/19 83

## 2020-01-17 ENCOUNTER — Ambulatory Visit: Payer: Managed Care, Other (non HMO)

## 2020-03-24 ENCOUNTER — Other Ambulatory Visit: Payer: Self-pay | Admitting: Osteopathic Medicine

## 2020-06-03 ENCOUNTER — Other Ambulatory Visit: Payer: Self-pay | Admitting: Osteopathic Medicine

## 2020-06-04 NOTE — Telephone Encounter (Signed)
Last refill-03/26/2020 Last Ov- 02/27/2020

## 2020-11-10 ENCOUNTER — Encounter: Payer: Self-pay | Admitting: Medical-Surgical

## 2020-11-10 ENCOUNTER — Telehealth (INDEPENDENT_AMBULATORY_CARE_PROVIDER_SITE_OTHER): Payer: Managed Care, Other (non HMO) | Admitting: Medical-Surgical

## 2020-11-10 VITALS — HR 90 | Resp 18

## 2020-11-10 DIAGNOSIS — J4530 Mild persistent asthma, uncomplicated: Secondary | ICD-10-CM

## 2020-11-10 DIAGNOSIS — J329 Chronic sinusitis, unspecified: Secondary | ICD-10-CM | POA: Diagnosis not present

## 2020-11-10 MED ORDER — PREDNISONE 50 MG PO TABS: 50.0000 mg | ORAL_TABLET | Freq: Every day | ORAL | 0 refills | Status: DC

## 2020-11-10 MED ORDER — ALBUTEROL SULFATE HFA 108 (90 BASE) MCG/ACT IN AERS: 1.0000 | INHALATION_SPRAY | Freq: Four times a day (QID) | RESPIRATORY_TRACT | 5 refills | Status: DC | PRN

## 2020-11-10 MED ORDER — DOXYCYCLINE HYCLATE 100 MG PO TABS: 100.0000 mg | ORAL_TABLET | Freq: Two times a day (BID) | ORAL | 0 refills | Status: AC

## 2020-11-10 MED ORDER — MONTELUKAST SODIUM 10 MG PO TABS: 10.0000 mg | ORAL_TABLET | Freq: Every day | ORAL | 3 refills | Status: DC

## 2020-11-10 NOTE — Progress Notes (Signed)
Virtual Visit via Video Note  I connected with Grant Peterson on 11/10/20 at  9:40 AM EST by a video enabled telemedicine application and verified that I am speaking with the correct person using two identifiers.   I discussed the limitations of evaluation and management by telemedicine and the availability of in person appointments. The patient expressed understanding and agreed to proceed.  Patient location: home Provider locations: office  Subjective:    CC: Sinus symptoms  HPI: Pleasant 49 year old male presenting via MyChart video visit with reports of 2.5 months of sinus symptoms including nasal congestion, sinus pressure, headaches, postnasal drip, and wheezing.  He has a history of chronic sinusitis that has been resistant to treatment with Augmentin.  Notes that he does not have a cough, fever, or chills.  Normally uses albuterol inhaler for wheezing but does not have this at home and needs a refill.  Also needs refills for Singulair as he is run out.  Notes that he did have a broken pipe in his kitchen approximately 1 month ago that the plumber is coming to fix today.  Is worried that the mold in the home may have contributed to his symptoms worsening.  Past medical history, Surgical history, Family history not pertinant except as noted below, Social history, Allergies, and medications have been entered into the medical record, reviewed, and corrections made.   Review of Systems: See HPI for pertinent positives and negatives.   Objective:    General: Speaking clearly in complete sentences without any shortness of breath.  Alert and oriented x3.  Normal judgment. No apparent acute distress.  Impression and Recommendations:    1. Chronic sinusitis, unspecified location Since he has been resistant in the past to Augmentin, we will go ahead and prescribe doxycycline twice daily x7 days.  Also starting a prednisone burst with the severity of his symptoms as well as the longevity.   Refilling Singulair.  Continue cetirizine and fluticasone nasal spray as prescribed.  2. Asthma, chronic, mild persistent, uncomplicated Refilling albuterol inhaler. - albuterol (VENTOLIN HFA) 108 (90 Base) MCG/ACT inhaler; Inhale 1-2 puffs into the lungs every 6 (six) hours as needed for wheezing or shortness of breath.  Dispense: 8 g; Refill: 5 - montelukast (SINGULAIR) 10 MG tablet; Take 1 tablet (10 mg total) by mouth at bedtime.  Dispense: 90 tablet; Refill: 3  I discussed the assessment and treatment plan with the patient. The patient was provided an opportunity to ask questions and all were answered. The patient agreed with the plan and demonstrated an understanding of the instructions.   The patient was advised to call back or seek an in-person evaluation if the symptoms worsen or if the condition fails to improve as anticipated.  20 minutes of non-face-to-face time was provided during this encounter.  Return if symptoms worsen or fail to improve.  Thayer Ohm, DNP, APRN, FNP-BC [facility]

## 2020-11-19 ENCOUNTER — Telehealth: Payer: Self-pay

## 2020-11-19 DIAGNOSIS — Z0184 Encounter for antibody response examination: Secondary | ICD-10-CM

## 2020-11-19 NOTE — Telephone Encounter (Signed)
Grant Peterson called wanting a titer for hep B for employment. Please advise.

## 2020-11-19 NOTE — Telephone Encounter (Signed)
Order is in.

## 2020-11-19 NOTE — Telephone Encounter (Signed)
Patient advised.

## 2020-11-25 LAB — HEPATITIS B SURFACE ANTIBODY,QUALITATIVE: Hep B S Ab: REACTIVE — AB

## 2020-12-01 ENCOUNTER — Ambulatory Visit: Payer: Managed Care, Other (non HMO) | Attending: Internal Medicine

## 2020-12-01 ENCOUNTER — Other Ambulatory Visit (HOSPITAL_BASED_OUTPATIENT_CLINIC_OR_DEPARTMENT_OTHER): Payer: Self-pay | Admitting: Internal Medicine

## 2020-12-01 DIAGNOSIS — Z23 Encounter for immunization: Secondary | ICD-10-CM

## 2020-12-01 NOTE — Progress Notes (Signed)
   Covid-19 Vaccination Clinic  Name:  Grant Peterson    MRN: [id number] DOB: [birth year]  12/01/2020  [name] was observed post Covid-19 immunization for 15 minutes without incident. She was provided with Vaccine Information Sheet and instruction to access the V-Safe system.   Vaccinated by [employer]  [name] was instructed to call 911 with any severe reactions post vaccine: Marland Kitchen Difficulty breathing  . Swelling of face and throat  . A fast heartbeat  . A bad rash all over body  . Dizziness and weakness   Immunizations Administered    Name Date Dose VIS Date Route   Pfizer COVID-19 Vaccine 12/01/2020  9:03 AM 0.3 mL 09/23/2020 Intramuscular   Manufacturer: [employer]   Lot: [id number]   NDC: [address]-0

## 2021-08-03 ENCOUNTER — Emergency Department
Admission: EM | Admit: 2021-08-03 | Discharge: 2021-08-03 | Disposition: A | Discharge status: Home / Self Care | Payer: Managed Care, Other (non HMO) | Source: Home / Self Care

## 2021-08-03 ENCOUNTER — Other Ambulatory Visit: Payer: Self-pay

## 2021-08-03 DIAGNOSIS — H1013 Acute atopic conjunctivitis, bilateral: Secondary | ICD-10-CM | POA: Diagnosis not present

## 2021-08-03 DIAGNOSIS — J4541 Moderate persistent asthma with (acute) exacerbation: Secondary | ICD-10-CM | POA: Diagnosis not present

## 2021-08-03 DIAGNOSIS — Z9109 Other allergy status, other than to drugs and biological substances: Secondary | ICD-10-CM | POA: Diagnosis not present

## 2021-08-03 DIAGNOSIS — R030 Elevated blood-pressure reading, without diagnosis of hypertension: Secondary | ICD-10-CM

## 2021-08-03 MED ORDER — SYSTANE OVERNIGHT THERAPY 0.3 % OP GEL: Freq: Every evening | OPHTHALMIC | 0 refills | Status: DC | PRN

## 2021-08-03 MED ORDER — DOXYCYCLINE HYCLATE 100 MG PO CAPS: 100.0000 mg | ORAL_CAPSULE | Freq: Two times a day (BID) | ORAL | 0 refills | Status: DC

## 2021-08-03 MED ORDER — PREDNISONE 20 MG PO TABS: 20.0000 mg | ORAL_TABLET | Freq: Two times a day (BID) | ORAL | 0 refills | Status: DC

## 2021-08-03 MED ORDER — FLUTICASONE FUROATE 27.5 MCG/SPRAY NA SUSP: 2.0000 | Freq: Every day | NASAL | 12 refills | Status: DC

## 2021-08-03 NOTE — ED Triage Notes (Signed)
Pt c/o eye problem to both eyes x 1 mos. Red, itchy, burning and watering. Sometimes waking up with crusted shut. No OTC meds tried. Sinus issues started one week ago. Hx of seasonal allergies. Benedryl and Zyrtec prn.

## 2021-08-03 NOTE — ED Provider Notes (Signed)
[facility]    CSN: [id number] Arrival date & time: 08/03/21  1249      History   Chief Complaint Chief Complaint  Patient presents with   Eye Problem    Bilateral    HPI Grant Peterson is a 50 y.o. male.   HPI  Patient has longstanding allergies.  Recently he has been having eye symptoms.  For the last month he gets grainy feeling in his eyes, tearing, and they itch.  He states he has never been diagnosed with conjunctivitis or given an eyedrop.  He has multiple medicines prescribed for allergies and asthma including cetirizine, Flonase, albuterol, Singulair, and Breo Ellipta.  He uses them as needed.  In the last week he is also developed some nasal congestion, rhinorrhea, postnasal drip  Past Medical History:  Diagnosis Date   Adult respiratory distress syndrome ([facility])    Anxiety    Frequent sinus infections    Reactive airway disease     Patient Active Problem List   Diagnosis Date Noted   Abdominal bloating 08/22/2018   Cervical radiculopathy 12/07/2016   Hyperlipidemia 02/05/2015   Hypertriglyceridemia 11/20/2014   Depression 03/17/2014   Asthma, chronic 03/17/2014   Seasonal allergies 03/17/2014   Inhalation injury 03/17/2014   Constipation 03/17/2014    Past Surgical History:  Procedure Laterality Date   BUNIONECTOMY  04/2008   right       Home Medications    Prior to Admission medications   Medication Sig Start Date End Date Taking? Authorizing Provider  doxycycline (VIBRAMYCIN) 100 MG capsule Take 1 capsule (100 mg total) by mouth 2 (two) times daily. 08/03/21  Yes Eustace Moore, MD  fluticasone (VERAMYST) 27.5 MCG/SPRAY nasal spray Place 2 sprays into the nose daily. 08/03/21  Yes Eustace Moore, MD  hypromellose (SYSTANE OVERNIGHT THERAPY) 0.3 % GEL ophthalmic ointment Place into both eyes at bedtime as needed and may repeat dose one time if needed for dry eyes. 08/03/21  Yes Eustace Moore, MD  predniSONE (DELTASONE) 20  MG tablet Take 1 tablet (20 mg total) by mouth 2 (two) times daily with a meal. 08/03/21  Yes Eustace Moore, MD  albuterol (VENTOLIN HFA) 108 (90 Base) MCG/ACT inhaler Inhale 1-2 puffs into the lungs every 6 (six) hours as needed for wheezing or shortness of breath. 11/10/20   Christen Butter, NP  buPROPion (WELLBUTRIN XL) 150 MG 24 hr tablet Take 1 tablet (150 mg total) by mouth daily. 01/31/19   Sunnie Nielsen, DO  Cetirizine HCl 10 MG CAPS Take 1 capsule (10 mg total) by mouth daily. 12/20/12   Floydene Flock, MD  COVID-19 mRNA vaccine, Pfizer, 30 MCG/0.3ML injection INJECT AS DIRECTED 12/01/20 12/01/21  Judyann Munson, MD  dicyclomine (BENTYL) 20 MG tablet TAKE 1 TABLET BY MOUTH THREE TIMES DAILY BEFORE MEAL(S) AS  NEEDED  FOR  ABDOMINAL  SPASM 06/04/20   Sunnie Nielsen, DO  fluticasone [facility]) 50 MCG/ACT nasal spray Place 2 sprays into both nostrils daily. 04/22/19   Sunnie Nielsen, DO  fluticasone furoate-vilanterol (BREO ELLIPTA) 100-25 MCG/INH AEPB One inhalation daily. 08/22/18   Sunnie Nielsen, DO  ibuprofen (ADVIL,MOTRIN) 600 MG tablet Take 1 tablet (600 mg total) by mouth every 6 (six) hours as needed. 11/09/16   Lurene Shadow, PA-C  ipratropium (ATROVENT) 0.06 % nasal spray Place 2 sprays into both nostrils 4 (four) times daily. 04/22/19   Sunnie Nielsen, DO  meloxicam (MOBIC) 15 MG tablet Take 15 mg by mouth daily.  [provider]  montelukast (SINGULAIR) 10 MG tablet Take 1 tablet (10 mg total) by mouth at bedtime. 11/10/20   Christen Butter, NP  omeprazole (PRILOSEC) 10 MG capsule Take 10 mg by mouth daily.    [provider]    Family History Family History  Problem Relation Age of Onset   Asthma Mother    COPD Mother    Hypertension Father    Hyperlipidemia Father    Parkinsonism Father    Alcoholism Other        grandfather   Lung cancer Other        grandfather    Social History Social History   Tobacco Use   Smoking status: Some  Days   Smokeless tobacco: Never   Tobacco comments:    Pt smokes 1 - 2 cigars/month  Vaping Use   Vaping Use: Never used  Substance Use Topics   Alcohol use: Yes    Comment: socially   Drug use: No     Allergies   Zoloft [sertraline hcl]   Review of Systems Review of Systems See HPI  Physical Exam Triage Vital Signs ED Triage Vitals  Enc Vitals Group     BP 08/03/21 1310 (!) 153/101     Pulse Rate 08/03/21 1310 79     Resp 08/03/21 1310 18     Temp 08/03/21 1310 98.4 F (36.9 C)     Temp Source 08/03/21 1310 Oral     SpO2 08/03/21 1310 98 %     Weight --      Height --      Head Circumference --      Peak Flow --      Pain Score 08/03/21 1311 0     Pain Loc --      Pain Edu? --      Excl. in GC? --    No data found.  Updated Vital Signs BP (!) 153/101 (BP Location: Right Arm)   Pulse 79   Temp 98.4 F (36.9 C) (Oral)   Resp 18   SpO2 98%    Physical Exam Constitutional:      General: He is not in acute distress.    Appearance: Normal appearance. He is well-developed.  HENT:     Head: Normocephalic and atraumatic.     Right Ear: Tympanic membrane and ear canal normal.     Left Ear: Tympanic membrane and ear canal normal.     Nose: Congestion and rhinorrhea present.     Mouth/Throat:     Mouth: Mucous membranes are moist.     Pharynx: Posterior oropharyngeal erythema present.  Eyes:     Pupils: Pupils are equal, round, and reactive to light.     Comments: Bilateral conjunctival injection.  Nasal membrane swollen and red  Cardiovascular:     Rate and Rhythm: Normal rate and regular rhythm.     Heart sounds: Normal heart sounds.  Pulmonary:     Effort: Pulmonary effort is normal. No respiratory distress.     Breath sounds: Wheezing present. No rales.     Comments: Scattered wheeze throughout on inspiration Abdominal:     General: There is no distension.     Palpations: Abdomen is soft.  Musculoskeletal:        General: Normal range of motion.      Cervical back: Normal range of motion.  Skin:    General: Skin is warm and dry.  Neurological:     Mental Status: He  is alert.  Psychiatric:        Mood and Affect: Mood normal.        Behavior: Behavior normal.     UC Treatments / Results  Labs (all labs ordered are listed, but only abnormal results are displayed) Labs Reviewed - No data to display  EKG   Radiology No results found.  Procedures Procedures (including critical care time)  Medications Ordered in UC Medications - No data to display  Initial Impression / Assessment and Plan / UC Course  I have reviewed the triage vital signs and the nursing notes.  Pertinent labs & imaging results that were available during my care of the patient were reviewed by me and considered in my medical decision making (see chart for details).     For chronic allergies recommend Veramyst daily.  This will help protect the eyes.  He should use a lubricant eyedrop at bedtime to prevent dry eyes. For asthma with exacerbation and going to give him prednisone and Vibramycin.  Take twice a day for a week. Follow-up with primary care regarding elevated blood pressure Final Clinical Impressions(s) / UC Diagnoses   Final diagnoses:  Environmental allergies  Moderate persistent asthma with acute exacerbation  Allergic conjunctivitis of both eyes  Elevated blood pressure reading without diagnosis of hypertension     Discharge Instructions      Stop flonase Start the veramyst Take antibiotic 2 times a day Take prednisone 2 times a day Use systane or equivalent moisture eye drop at night Return as needed     ED Prescriptions     Medication Sig Dispense Auth. Provider   fluticasone (VERAMYST) 27.5 MCG/SPRAY nasal spray Place 2 sprays into the nose daily. 10 g Eustace Moore, MD   hypromellose (SYSTANE OVERNIGHT THERAPY) 0.3 % GEL ophthalmic ointment Place into both eyes at bedtime as needed and may repeat dose one time  if needed for dry eyes. 10 g Eustace Moore, MD   doxycycline (VIBRAMYCIN) 100 MG capsule Take 1 capsule (100 mg total) by mouth 2 (two) times daily. 14 capsule Eustace Moore, MD   predniSONE (DELTASONE) 20 MG tablet Take 1 tablet (20 mg total) by mouth 2 (two) times daily with a meal. 10 tablet Eustace Moore, MD      PDMP not reviewed this encounter.   Eustace Moore, MD 08/03/21 [phone]

## 2021-08-03 NOTE — Discharge Instructions (Addendum)
Stop flonase Start the veramyst Take antibiotic 2 times a day Take prednisone 2 times a day Use systane or equivalent moisture eye drop at night Return as needed

## 2021-09-24 ENCOUNTER — Ambulatory Visit: Payer: Managed Care, Other (non HMO) | Admitting: Medical-Surgical

## 2021-09-29 ENCOUNTER — Emergency Department (INDEPENDENT_AMBULATORY_CARE_PROVIDER_SITE_OTHER)
Admission: EM | Admit: 2021-09-29 | Discharge: 2021-09-29 | Disposition: A | Discharge status: Home / Self Care | Payer: Managed Care, Other (non HMO) | Source: Home / Self Care

## 2021-09-29 ENCOUNTER — Emergency Department
Admission: EM | Admit: 2021-09-29 | Discharge: 2021-09-29 | Disposition: A | Discharge status: Home / Self Care | Payer: Managed Care, Other (non HMO) | Source: Home / Self Care

## 2021-09-29 ENCOUNTER — Other Ambulatory Visit: Payer: Self-pay

## 2021-09-29 DIAGNOSIS — R062 Wheezing: Secondary | ICD-10-CM | POA: Diagnosis not present

## 2021-09-29 DIAGNOSIS — J019 Acute sinusitis, unspecified: Secondary | ICD-10-CM

## 2021-09-29 DIAGNOSIS — J309 Allergic rhinitis, unspecified: Secondary | ICD-10-CM

## 2021-09-29 DIAGNOSIS — B9689 Other specified bacterial agents as the cause of diseases classified elsewhere: Secondary | ICD-10-CM | POA: Diagnosis not present

## 2021-09-29 MED ORDER — CEFDINIR 300 MG PO CAPS: 300.0000 mg | ORAL_CAPSULE | Freq: Two times a day (BID) | ORAL | 0 refills | Status: AC

## 2021-09-29 MED ORDER — FEXOFENADINE HCL 180 MG PO TABS: 180.0000 mg | ORAL_TABLET | Freq: Every day | ORAL | 0 refills | Status: DC

## 2021-09-29 MED ORDER — PREDNISONE 20 MG PO TABS: ORAL_TABLET | ORAL | 0 refills | Status: DC

## 2021-09-29 NOTE — ED Triage Notes (Signed)
Pt presents with URI sx that began 11 days. Pt denies fever.

## 2021-09-29 NOTE — Discharge Instructions (Addendum)
Advised patient to take medication as directed with food to completion.  Advised patient to take prednisone burst and Allegra with first dose of antibiotic for 5 of 7 days.  May use Allegra afterwards as needed for concurrent postnasal drainage/drip.  Encouraged patient to increase daily water intake while taking these medications.

## 2021-09-29 NOTE — ED Provider Notes (Signed)
[name]    CSN: [id number] Arrival date & time: 09/29/21  1038      History   Chief Complaint Chief Complaint  Patient presents with  . Headache  . Wheezing  . Cough  . Fatigue    HPI Grant Peterson is a 50 y.o. adult.   HPI patient presents with upper respiratory symptoms for 11 days.  Denies fever.  PMH significant for frequent sinus infections and reactive airway disease.  Past Medical History:  Diagnosis Date  . Adult respiratory distress syndrome (HCC)   . Anxiety   . Frequent sinus infections   . Reactive airway disease     Patient Active Problem List   Diagnosis Date Noted  . Abdominal bloating 08/22/2018  . Cervical radiculopathy 12/07/2016  . Hyperlipidemia 02/05/2015  . Hypertriglyceridemia 11/20/2014  . Depression 03/17/2014  . Asthma, chronic 03/17/2014  . Seasonal allergies 03/17/2014  . Inhalation injury 03/17/2014  . Constipation 03/17/2014    Past Surgical History:  Procedure Laterality Date  . BUNIONECTOMY  04/2008   right       Home Medications    Prior to Admission medications   Medication Sig Start Date End Date Taking? Authorizing Provider  cefdinir (OMNICEF) 300 MG capsule Take 1 capsule (300 mg total) by mouth 2 (two) times daily for 7 days. 09/29/21 10/06/21 Yes Trevor Iha, FNP  fexofenadine [facility] ALLERGY) 180 MG tablet Take 1 tablet (180 mg total) by mouth daily for 15 days. 09/29/21 10/14/21 Yes Trevor Iha, FNP  predniSONE (DELTASONE) 20 MG tablet Take 3 tabs PO x 5 days. 09/29/21  Yes Trevor Iha, FNP  albuterol (VENTOLIN HFA) 108 (90 Base) MCG/ACT inhaler Inhale 1-2 puffs into the lungs every 6 (six) hours as needed for wheezing or shortness of breath. 11/10/20   Christen Butter, NP  buPROPion (WELLBUTRIN XL) 150 MG 24 hr tablet Take 1 tablet (150 mg total) by mouth daily. 01/31/19   Sunnie Nielsen, DO  COVID-19 mRNA vaccine, Pfizer, 30 MCG/0.3ML injection INJECT AS DIRECTED 12/01/20 12/01/21   Judyann Munson, MD  dicyclomine (BENTYL) 20 MG tablet TAKE 1 TABLET BY MOUTH THREE TIMES DAILY BEFORE MEAL(S) AS  NEEDED  FOR  ABDOMINAL  SPASM Patient not taking: Reported on 09/29/2021 06/04/20   Sunnie Nielsen, DO  fluticasone furoate-vilanterol (BREO ELLIPTA) 100-25 MCG/INH AEPB One inhalation daily. Patient not taking: Reported on 09/29/2021 08/22/18   Sunnie Nielsen, DO  hypromellose (SYSTANE OVERNIGHT THERAPY) 0.3 % GEL ophthalmic ointment Place into both eyes at bedtime as needed and may repeat dose one time if needed for dry eyes. 08/03/21   Eustace Moore, MD  ibuprofen (ADVIL,MOTRIN) 600 MG tablet Take 1 tablet (600 mg total) by mouth every 6 (six) hours as needed. 11/09/16   Lurene Shadow, PA-C  ipratropium (ATROVENT) 0.06 % nasal spray Place 2 sprays into both nostrils 4 (four) times daily. 04/22/19   Sunnie Nielsen, DO  meloxicam (MOBIC) 15 MG tablet Take 15 mg by mouth daily. Patient not taking: Reported on 09/29/2021    [provider]  montelukast (SINGULAIR) 10 MG tablet Take 1 tablet (10 mg total) by mouth at bedtime. Patient not taking: Reported on 09/29/2021 11/10/20   Christen Butter, NP  omeprazole (PRILOSEC) 10 MG capsule Take 10 mg by mouth daily.    [provider]    Family History Family History  Problem Relation Age of Onset  . Asthma Mother   . COPD Mother   . Hypertension Father   .  Hyperlipidemia Father   . Parkinsonism Father   . Alcoholism Other        grandfather  . Lung cancer Other        grandfather    Social History Social History   Tobacco Use  . Smoking status: Some Days  . Smokeless tobacco: Never  . Tobacco comments:    Pt smokes 1 - 2 cigars/month  Vaping Use  . Vaping Use: Never used  Substance Use Topics  . Alcohol use: Yes    Comment: socially  . Drug use: No     Allergies   Zoloft [sertraline hcl]   Review of Systems Review of Systems  Constitutional:  Positive for fatigue.  HENT:  Positive  for congestion.   Respiratory:  Positive for cough and wheezing.   All other systems reviewed and are negative.   Physical Exam Triage Vital Signs ED Triage Vitals  Enc Vitals Group     BP 09/29/21 1123 (!) 155/103     Pulse Rate 09/29/21 1123 85     Resp 09/29/21 1123 14     Temp 09/29/21 1123 98.2 F (36.8 C)     Temp Source 09/29/21 1123 Oral     SpO2 09/29/21 1123 97 %     Weight --      Height --      Head Circumference --      Peak Flow --      Pain Score 09/29/21 1107 2     Pain Loc --      Pain Edu? --      Excl. in [place]? --    No data found.  Updated Vital Signs BP 140/90 (BP Location: Right Arm)   Pulse 85   Temp 98.2 F (36.8 C) (Oral)   Resp 14   SpO2 97%    Physical Exam Vitals and nursing note reviewed.  Constitutional:      General: Grant Peterson is not in acute distress.    Appearance: Normal appearance. Grant Peterson is normal weight. Grant Peterson is not ill-appearing.  HENT:     Head: Normocephalic and atraumatic.     Right Ear: Tympanic membrane, ear canal and external ear normal.     Left Ear: Tympanic membrane, ear canal and external ear normal.     Mouth/Throat:     Mouth: Mucous membranes are moist.     Pharynx: Oropharynx is clear.     Comments: Moderate amount of clear drainage of posterior oropharynx noted Eyes:     Extraocular Movements: Extraocular movements intact.     Conjunctiva/sclera: Conjunctivae normal.     Pupils: Pupils are equal, round, and reactive to light.  Cardiovascular:     Rate and Rhythm: Regular rhythm.     Pulses: Normal pulses.     Heart sounds: Normal heart sounds.  Pulmonary:     Effort: Pulmonary effort is normal.     Breath sounds: Wheezing present.     Comments: 4-5 tiny audible inspiratory wheezes noticed over bases Musculoskeletal:        General: Normal range of motion.     Cervical back: Normal range of motion and neck supple.  Skin:    General: Skin is warm and dry.  Neurological:      General: No focal deficit present.     Mental Status: Grant Peterson is alert and oriented to person, place, and time. Mental status is at baseline.     UC Treatments / Results  Labs (all labs  ordered are listed, but only abnormal results are displayed) Labs Reviewed - No data to display  EKG   Radiology No results found.  Procedures Procedures (including critical care time)  Medications Ordered in UC Medications - No data to display  Initial Impression / Assessment and Plan / UC Course  I have reviewed the triage vital signs and the nursing notes.  Pertinent labs & imaging results that were available during my care of the patient were reviewed by me and considered in my medical decision making (see chart for details).     MDM: 1.  Acute bacterial rhinosinusitis-Rx'd cefdinir; 2.  Allergic rhinitis-Rx'd Allegra; 3.  Wheezing-Rx'd Prednisone burst. Advised patient to take medication as directed with food to completion.  Advised patient to take prednisone burst and Allegra with first dose of antibiotic for 5 of 7 days.  May use Allegra afterwards as needed for concurrent postnasal drainage/drip.  Encouraged patient to increase daily water intake while taking these medications.  Patient discharged home, hemodynamically stable. Final Clinical Impressions(s) / UC Diagnoses   Final diagnoses:  Acute bacterial rhinosinusitis  Allergic rhinitis, unspecified seasonality, unspecified trigger  Wheezing     Discharge Instructions      Advised patient to take medication as directed with food to completion.  Advised patient to take prednisone burst and Allegra with first dose of antibiotic for 5 of 7 days.  May use Allegra afterwards as needed for concurrent postnasal drainage/drip.  Encouraged patient to increase daily water intake while taking these medications.     ED Prescriptions     Medication Sig Dispense Auth. Provider   cefdinir (OMNICEF) 300 MG capsule Take 1 capsule  (300 mg total) by mouth 2 (two) times daily for 7 days. 14 capsule Trevor Iha, FNP   fexofenadine [facility] ALLERGY) 180 MG tablet Take 1 tablet (180 mg total) by mouth daily for 15 days. 15 tablet Trevor Iha, FNP   predniSONE (DELTASONE) 20 MG tablet Take 3 tabs PO x 5 days. 15 tablet Trevor Iha, FNP      PDMP not reviewed this encounter.   Trevor Iha, FNP 09/29/21 1234

## 2021-12-19 ENCOUNTER — Emergency Department
Admission: EM | Admit: 2021-12-19 | Discharge: 2021-12-19 | Discharge status: Against Medical Advice | Payer: Managed Care, Other (non HMO) | Source: Home / Self Care

## 2021-12-19 ENCOUNTER — Other Ambulatory Visit: Payer: Self-pay

## 2023-02-04 ENCOUNTER — Encounter: Payer: Self-pay | Admitting: Emergency Medicine

## 2023-02-04 ENCOUNTER — Ambulatory Visit
Admission: EM | Admit: 2023-02-04 | Discharge: 2023-02-04 | Disposition: A | Discharge status: Home / Self Care | Payer: BC Managed Care – PPO | Attending: Family Medicine | Admitting: Family Medicine

## 2023-02-04 DIAGNOSIS — B9689 Other specified bacterial agents as the cause of diseases classified elsewhere: Secondary | ICD-10-CM

## 2023-02-04 DIAGNOSIS — Z8709 Personal history of other diseases of the respiratory system: Secondary | ICD-10-CM

## 2023-02-04 DIAGNOSIS — R062 Wheezing: Secondary | ICD-10-CM | POA: Diagnosis not present

## 2023-02-04 DIAGNOSIS — J019 Acute sinusitis, unspecified: Secondary | ICD-10-CM | POA: Diagnosis not present

## 2023-02-04 MED ORDER — DOXYCYCLINE HYCLATE 100 MG PO CAPS: 100.0000 mg | ORAL_CAPSULE | Freq: Two times a day (BID) | ORAL | 0 refills | Status: AC

## 2023-02-04 MED ORDER — ALBUTEROL SULFATE HFA 108 (90 BASE) MCG/ACT IN AERS: 1.0000 | INHALATION_SPRAY | Freq: Four times a day (QID) | RESPIRATORY_TRACT | 0 refills | Status: AC | PRN

## 2023-02-04 MED ORDER — PREDNISONE 20 MG PO TABS: ORAL_TABLET | ORAL | 0 refills | Status: AC

## 2023-02-04 NOTE — ED Provider Notes (Signed)
[facility]    CSN: [id number] Arrival date & time: 02/04/23  1240      History   Chief Complaint Chief Complaint  Patient presents with   Nasal Congestion    HPI Grant Peterson is a 52 y.o. adult.   HPI  Has been sick for a month with sinus congestion postnasal drip and coughing.  A week ago he started wheezing.  He does not have any underlying asthma and does not have an inhaler.  He has not had much cough.  He was told a couple years ago that he had nasal polyps, and this was causing his frequent sinus infections.  It was recommended these be removed but he has not yet had surgery.  This patient states that he cannot breathe through his nose at all.  His face is full and uncomfortable.  He is having headaches  Past Medical History:  Diagnosis Date   Adult respiratory distress syndrome (HCC)    Anxiety    Frequent sinus infections    Reactive airway disease     Patient Active Problem List   Diagnosis Date Noted   Abdominal bloating 08/22/2018   Cervical radiculopathy 12/07/2016   Hyperlipidemia 02/05/2015   Hypertriglyceridemia 11/20/2014   Depression 03/17/2014   Asthma, chronic 03/17/2014   Seasonal allergies 03/17/2014   Inhalation injury 03/17/2014   Constipation 03/17/2014    Past Surgical History:  Procedure Laterality Date   BUNIONECTOMY  04/2008   right       Home Medications    Prior to Admission medications   Medication Sig Start Date End Date Taking? Authorizing Provider  albuterol (VENTOLIN HFA) 108 (90 Base) MCG/ACT inhaler Inhale 1-2 puffs into the lungs every 6 (six) hours as needed for wheezing or shortness of breath. 02/04/23  Yes Raylene Everts, MD  doxycycline (VIBRAMYCIN) 100 MG capsule Take 1 capsule (100 mg total) by mouth 2 (two) times daily. 02/04/23  Yes Raylene Everts, MD  predniSONE (DELTASONE) 20 MG tablet Take 3 tablets today.  Starting tomorrow take 2 tablets daily for 5 days.  After that take 1 tablet daily  for 5 days.  Then discontinue 02/04/23  Yes Raylene Everts, MD  ibuprofen (ADVIL,MOTRIN) 600 MG tablet Take 1 tablet (600 mg total) by mouth every 6 (six) hours as needed. 11/09/16   Noe Gens, PA-C  omeprazole (PRILOSEC) 10 MG capsule Take 10 mg by mouth daily.    [provider]    Family History Family History  Problem Relation Age of Onset   Asthma Mother    COPD Mother    Hypertension Father    Hyperlipidemia Father    Parkinsonism Father    Alcoholism Other        grandfather   Lung cancer Other        grandfather    Social History Social History   Tobacco Use   Smoking status: Some Days    Types: Cigars   Smokeless tobacco: Never   Tobacco comments:    Pt smokes 1 - 2 cigars/month  Vaping Use   Vaping Use: Never used  Substance Use Topics   Alcohol use: Yes    Comment: socially   Drug use: No     Allergies   Zoloft [sertraline hcl]   Review of Systems Review of Systems See HPI  Physical Exam Triage Vital Signs ED Triage Vitals  Enc Vitals Group     BP 02/04/23 1320 (!) 137/92  Pulse Rate 02/04/23 1320 80     Resp 02/04/23 1320 16     Temp 02/04/23 1320 98.1 F (36.7 C)     Temp Source 02/04/23 1320 Oral     SpO2 02/04/23 1320 97 %     Weight 02/04/23 1325 184 lb 1.4 oz (83.5 kg)     Height 02/04/23 1325 '5\' 7"'$  (1.702 m)     Head Circumference --      Peak Flow --      Pain Score 02/04/23 1322 4     Pain Loc --      Pain Edu? --      Excl. in [place]? --    No data found.  Updated Vital Signs BP (!) 137/92 (BP Location: Left Arm)   Pulse 80   Temp 98.1 F (36.7 C) (Oral)   Resp 16   Ht '5\' 7"'$  (1.702 m)   Wt 83.5 kg   SpO2 97%   BMI 28.83 kg/m        Physical Exam Constitutional:      General: She is not in acute distress.    Appearance: She is well-developed and normal weight. She is not ill-appearing.     Comments: Friendly  HENT:     Head: Normocephalic and atraumatic.     Right Ear: Tympanic membrane and ear  canal normal.     Left Ear: Tympanic membrane and ear canal normal.     Nose: Congestion present.     Comments: Nasal membranes swollen shut    Mouth/Throat:     Pharynx: Posterior oropharyngeal erythema present.     Comments: Uvula swollen.  Erythema and swelling of posterior pharynx, moderate Eyes:     Conjunctiva/sclera: Conjunctivae normal.     Pupils: Pupils are equal, round, and reactive to light.  Cardiovascular:     Rate and Rhythm: Normal rate and regular rhythm.     Heart sounds: Normal heart sounds.  Pulmonary:     Effort: Pulmonary effort is normal. No respiratory distress.     Breath sounds: Wheezing present.     Comments: Inspiratory wheezes throughout both lungs Abdominal:     General: There is no distension.     Palpations: Abdomen is soft.  Musculoskeletal:        General: Normal range of motion.     Cervical back: Normal range of motion.  Lymphadenopathy:     Cervical: No cervical adenopathy.  Skin:    General: Skin is warm and dry.  Neurological:     Mental Status: She is alert.  Psychiatric:        Mood and Affect: Mood normal.      UC Treatments / Results  Labs (all labs ordered are listed, but only abnormal results are displayed) Labs Reviewed - No data to display  EKG   Radiology No results found.  Procedures Procedures (including critical care time)  Medications Ordered in UC Medications - No data to display  Initial Impression / Assessment and Plan / UC Course  I have reviewed the triage vital signs and the nursing notes.  Pertinent labs & imaging results that were available during my care of the patient were reviewed by me and considered in my medical decision making (see chart for details).     Final Clinical Impressions(s) / UC Diagnoses   Final diagnoses:  Acute bacterial sinusitis  History of nasal polyp  Wheezing     Discharge Instructions      Take doxycycline 2  times a day.  Be sure to take this medicine with  food Take the prednisone as directed. Use the albuterol inhaler every 6 hours as needed for shortness of breath or wheezing Make sure that you are drinking lots of water Place a humidifier in your bedroom if you have one available   ED Prescriptions     Medication Sig Dispense Auth. Provider   doxycycline (VIBRAMYCIN) 100 MG capsule Take 1 capsule (100 mg total) by mouth 2 (two) times daily. 20 capsule Raylene Everts, MD   predniSONE (DELTASONE) 20 MG tablet Take 3 tablets today.  Starting tomorrow take 2 tablets daily for 5 days.  After that take 1 tablet daily for 5 days.  Then discontinue 16 tablet Raylene Everts, MD   albuterol (VENTOLIN HFA) 108 (90 Base) MCG/ACT inhaler Inhale 1-2 puffs into the lungs every 6 (six) hours as needed for wheezing or shortness of breath. 18 g Raylene Everts, MD      PDMP not reviewed this encounter.   Raylene Everts, MD 02/04/23 [phone]

## 2023-02-04 NOTE — Discharge Instructions (Addendum)
Take doxycycline 2 times a day.  Be sure to take this medicine with food Take the prednisone as directed. Use the albuterol inhaler every 6 hours as needed for shortness of breath or wheezing Make sure that you are drinking lots of water Place a humidifier in your bedroom if you have one available

## 2023-02-04 NOTE — ED Triage Notes (Signed)
Sinus congestion x 1 month  Wheezing started a few days ago  Denies cough  Has seen by ENT  - has been told he has polyps & should have surgery

## 2023-02-04 NOTE — ED Notes (Addendum)
Dr Meda Coffee updated on pt's suicide screening assessment - pt currently not in therapy. Pt given resources for [facility]. Pt also to follow up with EAP at work.

## 2023-02-05 ENCOUNTER — Telehealth: Payer: Self-pay | Admitting: Emergency Medicine

## 2023-02-05 NOTE — Telephone Encounter (Signed)
Call to see how Grant Peterson was today. Unable to leave a message on voice mail per pt request.

## 2024-07-17 ENCOUNTER — Other Ambulatory Visit: Payer: Self-pay | Admitting: Medical Genetics

## 2024-09-24 ENCOUNTER — Other Ambulatory Visit: Payer: Self-pay | Admitting: Medical Genetics

## 2024-09-24 DIAGNOSIS — Z006 Encounter for examination for normal comparison and control in clinical research program: Secondary | ICD-10-CM

## 2024-11-24 LAB — GENECONNECT MOLECULAR SCREEN

## 2024-11-26 ENCOUNTER — Telehealth: Payer: Self-pay | Admitting: Medical Genetics

## 2024-12-03 NOTE — Telephone Encounter (Signed)
 [place] [facility]  12/03/2024 9:10 AM  Confirmed I was speaking with [employer] [phone] by using name and DOB. Informed participant the reason for this call is to follow-up on a recent sample the participant provided at one of the [facility] lab locations. Informed participant the test was not able to be completed with this sample and apologized for the inconvenience. Participant was requested to provide a new sample at one of our participating labs at no cost so that participant can continue participation and receive test results. Informed participant they do not need to be fasting and if there are other samples that need to be drawn, they can be done at the same visit. Participant has not had a blood transfusion or blood product in the last 30 days. Participant does not wish to provide another sample. Participant was provided the [employer] program website to learn why this may have happened. Participant was thanked for their time and continued support of the above study.    Participant requested to withdraw from [facility]. Withdraw has been completed.  Jordyn Pennstrom, BS   [facility] Clinical Research Specialist II Direct Dial: [phone]  Fax: [phone]
